# Patient Record
Sex: Male | Born: 1990 | Hispanic: No | Marital: Single | Smoking: Current every day smoker
Health system: Southern US, Community
[De-identification: ages and names within clinical notes are randomized; demographics above are authoritative.]

## PROBLEM LIST (undated history)

## (undated) DIAGNOSIS — Z9189 Other specified personal risk factors, not elsewhere classified: Secondary | ICD-10-CM

## (undated) HISTORY — PX: SHOULDER SURGERY: SHX246

---
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
## 2010-01-26 ENCOUNTER — Emergency Department (HOSPITAL_COMMUNITY)
Admission: EM | Admit: 2010-01-26 | Discharge: 2010-01-26 | Payer: Self-pay | Source: Home / Self Care | Admitting: Emergency Medicine

## 2010-02-06 ENCOUNTER — Encounter
Admission: RE | Admit: 2010-02-06 | Discharge: 2010-02-18 | Payer: Self-pay | Source: Home / Self Care | Attending: Orthopedic Surgery | Admitting: Orthopedic Surgery

## 2011-12-23 ENCOUNTER — Emergency Department (HOSPITAL_COMMUNITY)
Admission: EM | Admit: 2011-12-23 | Discharge: 2011-12-24 | Disposition: A | Discharge status: Home / Self Care | Payer: Self-pay | Attending: Emergency Medicine | Admitting: Emergency Medicine

## 2011-12-23 ENCOUNTER — Encounter (HOSPITAL_COMMUNITY): Payer: Self-pay | Admitting: Emergency Medicine

## 2011-12-23 ENCOUNTER — Emergency Department (HOSPITAL_COMMUNITY): Payer: Self-pay

## 2011-12-23 DIAGNOSIS — F17200 Nicotine dependence, unspecified, uncomplicated: Secondary | ICD-10-CM | POA: Insufficient documentation

## 2011-12-23 DIAGNOSIS — R569 Unspecified convulsions: Secondary | ICD-10-CM | POA: Insufficient documentation

## 2011-12-23 LAB — CBC WITH DIFFERENTIAL/PLATELET
Basophils Relative: 0 % (ref 0–1)
Eosinophils Absolute: 0.3 10*3/uL (ref 0.0–0.7)
Eosinophils Relative: 1 % (ref 0–5)
HCT: 42.3 % (ref 39.0–52.0)
Hemoglobin: 14.8 g/dL (ref 13.0–17.0)
MCH: 30.6 pg (ref 26.0–34.0)
MCHC: 35 g/dL (ref 30.0–36.0)
Monocytes Absolute: 1.5 10*3/uL — ABNORMAL HIGH (ref 0.1–1.0)
Monocytes Relative: 8 % (ref 3–12)

## 2011-12-23 NOTE — ED Provider Notes (Signed)
History     CSN: [id number]  Arrival date & time 12/23/11  2144   First MD Initiated Contact with Patient 12/23/11 2321      Chief Complaint  Patient presents with  . Seizures    (Consider location/radiation/quality/duration/timing/severity/associated sxs/prior treatment) HPI Comments: 21 year old male with no significant medical history who has never had a seizure who presents after having a first-time seizure this evening. His family members state that they saw him sitting in a chair after smoking a cigarette his head turned to the side, his eyes rolled back, he started frothing at the mouth and turning red in the face. He began having symmetrical convulsions of the upper and lower strumming these, he was the ground and held on his side. He states this lasted approximately 2-3 minutes before it stopped on by itself, there was a small amount of tongue biting but no sign of urinary incontinence. When he first woke up he has slight disorientation but had a rapid return to his baseline. He denies any other medications, he does not drink alcohol but once every couple of weeks, his last drink was half of beer on Monday night. He denies drug use, he does admit to having chronic headaches which seem to be getting worse over the last couple of months.  Patient is a 21 y.o. male presenting with seizures. The history is provided by the patient, a parent, a relative and the EMS personnel.  Seizures     History reviewed. No pertinent past medical history.  Past Surgical History  Procedure Date  . Shoulder surgery     Right shoulder    No family history on file.  History  Substance Use Topics  . Smoking status: Current Every Day Smoker    Types: Cigarettes  . Smokeless tobacco: Not on file  . Alcohol Use: Yes     Comment: occasionally       Review of Systems  Neurological: Positive for seizures.  All other systems reviewed and are negative.    Allergies  Review of patient's  allergies indicates no known allergies.  Home Medications   Current Outpatient Rx  Name  Route  Sig  Dispense  Refill  . IBUPROFEN 200 MG PO TABS   Oral   Take 600 mg by mouth every 6 (six) hours as needed. For pain           BP 139/87  Pulse 84  Temp 97.6 F (36.4 C) (Oral)  Resp 16  SpO2 100%  Physical Exam  Nursing note and vitals reviewed. Constitutional: He appears well-developed and well-nourished. No distress.  HENT:  Head: Normocephalic and atraumatic.  Mouth/Throat: Oropharynx is clear and moist. No oropharyngeal exudate.       2 very small areas of injury to the right and left side of the tongue, no lacerations to repair, mucous membranes moist  Eyes: Conjunctivae normal and EOM are normal. Pupils are equal, round, and reactive to light. Right eye exhibits no discharge. Left eye exhibits no discharge. No scleral icterus.  Neck: Normal range of motion. Neck supple. No JVD present. No thyromegaly present.  Cardiovascular: Normal rate, regular rhythm, normal heart sounds and intact distal pulses.  Exam reveals no gallop and no friction rub.   No murmur heard. Pulmonary/Chest: Effort normal and breath sounds normal. No respiratory distress. He has no wheezes. He has no rales.  Abdominal: Soft. Bowel sounds are normal. He exhibits no distension and no mass. There is no tenderness.  Musculoskeletal: Normal  range of motion. He exhibits no edema and no tenderness.  Lymphadenopathy:    He has no cervical adenopathy.  Neurological: He is alert. Coordination normal.       The patient has normal speech, normal memory, normal coordination of all 4 extremities without any limb ataxia. The patient has normal strength of all 4 extremities at the major muscle groups including shoulders, biceps, triceps, forearm, grips, quadriceps, hamstrings, calves. Normal sensation to light touch and pinprick of all 4 extremities and the trunk. No truncal ataxia, normal gait, cranial nerves III  through XII are intact.  Normal peripheral visual fields. Normal extraocular movements. Normal pupillary exam. No pronator drift, normal reflexes at the brachial radialis, biceps and patellar tendons. Normal position sense, normal temperature sensation to cold and warm  Skin: Skin is warm and dry. No rash noted. No erythema.  Psychiatric: He has a normal mood and affect. His behavior is normal.    ED Course  Procedures (including critical care time)  Labs Reviewed  GLUCOSE, CAPILLARY - Abnormal; Notable for the following:    Glucose-Capillary 101 (*)     All other components within normal limits   No results found.   No diagnosis found.    MDM  At this time the patient appears well, he is not having any seizure activity, his vital signs appear normal, his EKG is normal, because of chronic headaches will obtain CT scan lab work, reevaluate.   ED ECG REPORT  I personally interpreted this EKG   Date: 12/23/2011   Rate: 74  Rhythm: normal sinus rhythm  QRS Axis: normal  Intervals: normal  ST/T Wave abnormalities: normal  Conduction Disutrbances:none  Narrative Interpretation:   Old EKG Reviewed: none available  CT scan signs, lab work with leukocytosis but no other findings, patient stable discharge.  He has had no further seizures since his arrival and his mental status is normal. He has been informed of this laboratory and CT findings.       Vida Roller, MD 12/24/11 [phone]

## 2011-12-23 NOTE — ED Notes (Signed)
I gave the patient a warm blanket. 

## 2011-12-23 NOTE — ED Notes (Signed)
Pt family member reports that they were sitting at table and that pt smoked half a cigarette (pt does not remember smoking the cigarette). He handed the cigarette to family member and then began to shake. Pt. Eyes rolled back, head deviated to right and pt began to drool. Family member then placed pt. In the floor on side. Jerking movement lasted approx 3 mins and took approx 5 mins for pt to become alert again- per family member at bedside. No hx of past seizures. Pt is currently alert and oriented x4. No complaints.

## 2011-12-23 NOTE — ED Notes (Signed)
Pt arrived by ems. Family reports grand mal seizure lasting approx 5 min. Pt was post ictal after. Pt cool and diaphoretic upon ems arrival. Vs wnl. Bp 115/73. Hr 90's. 12 lead unremarkable. 20 G L ac. No complaints of pain. Pt did not fall. Pt is currently alert.

## 2011-12-24 LAB — BASIC METABOLIC PANEL
BUN: 13 mg/dL (ref 6–23)
Calcium: 9.7 mg/dL (ref 8.4–10.5)
Creatinine, Ser: 1.21 mg/dL (ref 0.50–1.35)
GFR calc Af Amer: >90 mL/min (ref >90)
GFR calc non Af Amer: 84 mL/min — ABNORMAL LOW (ref >90)

## 2011-12-24 MED ORDER — NAPROXEN 500 MG PO TABS: 500.0000 mg | ORAL_TABLET | Freq: Two times a day (BID) | ORAL | Status: DC

## 2014-06-02 ENCOUNTER — Encounter (HOSPITAL_COMMUNITY): Payer: Self-pay | Admitting: *Deleted

## 2014-06-02 ENCOUNTER — Emergency Department (HOSPITAL_COMMUNITY)
Admission: EM | Admit: 2014-06-02 | Discharge: 2014-06-02 | Disposition: A | Discharge status: Home / Self Care | Payer: Self-pay | Attending: Emergency Medicine | Admitting: Emergency Medicine

## 2014-06-02 DIAGNOSIS — K0889 Other specified disorders of teeth and supporting structures: Secondary | ICD-10-CM

## 2014-06-02 DIAGNOSIS — K088 Other specified disorders of teeth and supporting structures: Secondary | ICD-10-CM | POA: Insufficient documentation

## 2014-06-02 DIAGNOSIS — Z720 Tobacco use: Secondary | ICD-10-CM | POA: Insufficient documentation

## 2014-06-02 DIAGNOSIS — Z791 Long term (current) use of non-steroidal anti-inflammatories (NSAID): Secondary | ICD-10-CM | POA: Insufficient documentation

## 2014-06-02 MED ORDER — PENICILLIN V POTASSIUM 500 MG PO TABS: 500.0000 mg | ORAL_TABLET | Freq: Four times a day (QID) | ORAL | Status: AC

## 2014-06-02 MED ORDER — OXYCODONE-ACETAMINOPHEN 5-325 MG PO TABS: 1.0000 | ORAL_TABLET | ORAL | Status: DC | PRN

## 2014-06-02 MED ORDER — OXYCODONE-ACETAMINOPHEN 5-325 MG PO TABS
1.0000 | ORAL_TABLET | Freq: Once | ORAL | Status: AC
  Administered 2014-06-02: 1 via ORAL
  Filled 2014-06-02: qty 1

## 2014-06-02 NOTE — ED Provider Notes (Signed)
CSN: [id number]     Arrival date & time 06/02/14  0543 History   First MD Initiated Contact with Patient 06/02/14 0557     Chief Complaint  Patient presents with  . Dental Pain     (Consider location/radiation/quality/duration/timing/severity/associated sxs/prior Treatment) Patient is a 24 y.o. male presenting with tooth pain. The history is provided by the patient.  Dental Pain Location:  Upper and lower Upper teeth location:  16/LU 3rd molar and 1/RU 3rd molar Lower teeth location:  17/LL 3rd molar, 31/RL 2nd molar and 32/RL 3rd molar Quality:  Dull and aching Severity:  Mild Onset quality:  Gradual Duration:  2 months Timing:  Intermittent Progression:  Unchanged Chronicity:  New Context: dental caries   Relieved by:  Nothing Worsened by:  Nothing tried Ineffective treatments:  None tried Associated symptoms: no drooling, no fever, no headaches and no neck pain     History reviewed. No pertinent past medical history. Past Surgical History  Procedure Laterality Date  . Shoulder surgery      Right shoulder  . Shoulder surgery     History reviewed. No pertinent family history. History  Substance Use Topics  . Smoking status: Current Every Day Smoker    Types: Cigarettes  . Smokeless tobacco: Current User    Types: Chew  . Alcohol Use: Yes     Comment: occasionally     Review of Systems  Constitutional: Negative for fever.  HENT: Positive for dental problem. Negative for drooling and rhinorrhea.   Eyes: Negative for pain.  Respiratory: Negative for cough and shortness of breath.   Cardiovascular: Negative for chest pain and leg swelling.  Gastrointestinal: Negative for nausea, vomiting, abdominal pain and diarrhea.  Genitourinary: Negative for dysuria and hematuria.  Musculoskeletal: Negative for gait problem and neck pain.  Skin: Negative for color change.  Neurological: Negative for numbness and headaches.  Hematological: Negative for adenopathy.   Psychiatric/Behavioral: Negative for behavioral problems.  All other systems reviewed and are negative.     Allergies  Review of patient's allergies indicates no known allergies.  Home Medications   Prior to Admission medications   Medication Sig Start Date End Date Taking? Authorizing Provider  ibuprofen (ADVIL,MOTRIN) 200 MG tablet Take 600 mg by mouth every 6 (six) hours as needed. For pain    Historical Provider, MD  naproxen (NAPROSYN) 500 MG tablet Take 1 tablet (500 mg total) by mouth 2 (two) times daily with a meal. 12/24/11   Eber HongBrian Miller, MD   BP 126/86 mmHg  Pulse 71  Temp(Src) 97.6 F (36.4 C) (Oral)  Resp 16  SpO2 100% Physical Exam  Constitutional: He is oriented to person, place, and time. He appears well-developed and well-nourished.  HENT:  Head: Normocephalic and atraumatic.  Right Ear: External ear normal.  Left Ear: External ear normal.  Nose: Nose normal.  Mouth/Throat: Oropharynx is clear and moist. No oropharyngeal exudate.  No intraoral abscess noted.  Chronically fractured posterior molars.  No trismus. No facial swelling. Normal range of motion of the neck. Normal-appearing posterior oropharynx.  Eyes: Conjunctivae and EOM are normal. Pupils are equal, round, and reactive to light.  Neck: Normal range of motion. Neck supple.  Cardiovascular: Normal rate, regular rhythm, normal heart sounds and intact distal pulses.  Exam reveals no gallop and no friction rub.   No murmur heard. Pulmonary/Chest: Effort normal and breath sounds normal. No respiratory distress. He has no wheezes.  Abdominal: Soft. Bowel sounds are normal. He exhibits no distension.  There is no tenderness. There is no rebound and no guarding.  Musculoskeletal: Normal range of motion. He exhibits no edema or tenderness.  Neurological: He is alert and oriented to person, place, and time.  Skin: Skin is warm and dry.  Psychiatric: He has a normal mood and affect. His behavior is normal.   Nursing note and vitals reviewed.   ED Course  Procedures (including critical care time) Labs Review Labs Reviewed - No data to display  Imaging Review No results found.   EKG Interpretation None      MDM   Final diagnoses:  Pain, dental    6:19 AM 24 y.o. male who presents with intermittent dental pain for the last 1-2 months. He denies any fevers, vomiting, diarrhea. Vital signs unremarkable here. The patient was on antibiotics several weeks ago and is currently trying to get in to see a dentist. He has several chronically fractured teeth. Will restart antibiotics and give a small prescription for pain medicine.   Medications given during this visit Medications  oxyCODONE-acetaminophen (PERCOCET/ROXICET) 5-325 MG per tablet 1 tablet (not administered)    New Prescriptions   No medications on file       Purvis SheffieldForrest Evander Macaraeg, MD 06/02/14 [phone]

## 2014-06-02 NOTE — Discharge Instructions (Signed)
Dental Pain °A tooth ache may be caused by cavities (tooth decay). Cavities expose the nerve of the tooth to air and hot or cold temperatures. It may come from an infection or abscess (also called a boil or furuncle) around your tooth. It is also often caused by dental caries (tooth decay). This causes the pain you are having. °DIAGNOSIS  °Your caregiver can diagnose this problem by exam. °TREATMENT  °· If caused by an infection, it may be treated with medications which kill germs (antibiotics) and pain medications as prescribed by your caregiver. Take medications as directed. °· Only take over-the-counter or prescription medicines for pain, discomfort, or fever as directed by your caregiver. °· Whether the tooth ache today is caused by infection or dental disease, you should see your dentist as soon as possible for further care. °SEEK MEDICAL CARE IF: °The exam and treatment you received today has been provided on an emergency basis only. This is not a substitute for complete medical or dental care. If your problem worsens or new problems (symptoms) appear, and you are unable to meet with your dentist, call or return to this location. °SEEK IMMEDIATE MEDICAL CARE IF:  °· You have a fever. °· You develop redness and swelling of your face, jaw, or neck. °· You are unable to open your mouth. °· You have severe pain uncontrolled by pain medicine. °MAKE SURE YOU:  °· Understand these instructions. °· Will watch your condition. °· Will get help right away if you are not doing well or get worse. °Document Released: 01/05/2005 Document Revised: 03/30/2011 Document Reviewed: 08/24/2007 °ExitCare® Patient Information ©2015 [employer]. This information is not intended to replace advice given to you by your health care provider. Make sure you discuss any questions you have with your health care provider. ° °Emergency Department Resource Guide °1) Find a Doctor and Pay Out of Pocket °Although you won't have to find out who  is covered by your insurance plan, it is a good idea to ask around and get recommendations. You will then need to call the office and see if the doctor you have chosen will accept you as a new patient and what types of options they offer for patients who are self-pay. Some doctors offer discounts or will set up payment plans for their patients who do not have insurance, but you will need to ask so you aren't surprised when you get to your appointment. ° °2) Contact Your Local Health Department °Not all health departments have doctors that can see patients for sick visits, but many do, so it is worth a call to see if yours does. If you don't know where your local health department is, you can check in your phone book. The CDC also has a tool to help you locate your state's health department, and many state websites also have listings of all of their local health departments. ° °3) Find a Walk-in Clinic °If your illness is not likely to be very severe or complicated, you may want to try a walk in clinic. These are popping up all over the country in pharmacies, drugstores, and shopping centers. They're usually staffed by nurse practitioners or physician assistants that have been trained to treat common illnesses and complaints. They're usually fairly quick and inexpensive. However, if you have serious medical issues or chronic medical problems, these are probably not your best option. ° °No Primary Care Doctor: °- Call [facility] at  832-8000 - they can help you locate a primary   care doctor that  accepts your insurance, provides certain services, etc. °- Physician Referral Service- 1-800-533-3463 ° °Chronic Pain Problems: °Organization         Address  Phone   Notes  °[facility]  (336) 297-2271 Patients need to be referred by their primary care doctor.  ° °Medication Assistance: °Organization         Address  Phone   Notes  °[place] [facility] [address]  [address] °[place], [place] [address] (336) 641-8030 --Must be a resident of [place] °-- Must have NO insurance coverage whatsoever (no Medicaid/ Medicare, etc.) °-- The pt. MUST have a primary care doctor that directs their care regularly and follows them in the community °  °[facility]  (866) 331-1348   °[employer]  (888) 892-1162   ° °Agencies that provide inexpensive medical care: °Organization         Address  Phone   Notes  °[facility]  (336) 832-8035   °[facility]    (336) 832-7272   °[facility] [address] °[place], [place] [address] (336) 832-4777   °[facility] [address], °[place] (336) 271-4999   °[facility]    (336) 373-0678   °[facility]    (336) 272-1050   °[facility] ° [address], [place] Phone:  (336) 832-4444, Fax:  (336) 832-4440 Hours of Operation:  9 am - 6 pm, M-F.  Also accepts Medicaid/Medicare and self-pay.  °[facility] ° [address], [place] Phone: (336) 832-3150, Fax: (336) 832-3151. Hours of Operation:  8:30 am - 5:30 pm, M-F.  Also accepts Medicaid and self-pay.  °[facility] [place] [address], [place] Phone: (336) 878-6027   °[facility] [address], [place], [place] (336)723-1848, Ext. 123 Mondays & Thursdays: 7-9 AM.  First 15 patients are seen on a first come, first serve basis. °  ° °Medicaid-accepting [place] Providers: ° °Organization         Address  Phone   Notes  °[facility] [address], [place] (336) 641-2100 Also accepts self-pay patients.  °[facility] [address], [place] ° (336) 856-9996   °[facility] [address], [place] (336) 288-8857   °[facility] [address] [place] [address], [place] (336) 299-7000   °Veita Bland [address], [place]  ° (336) 373-1557 Only accepts [place] Access Medicaid patients after they have their name applied to their card.  ° °Self-Pay (no insurance) in [place]: ° °Organization         Address  Phone   Notes  °Sickle Cell Patients, [facility] [address], [place] (336) 832-1970   °[facility] [address], [place] (336) 832-4400   °[facility] ° [address], [place] (336) 992-4800   °[facility]/Dr. Osei-Bonsu ° [address] [place] [address], [place] or [address], [place] (336) 841-8500 Phone number for both [place] and [place] locations is the same.  °[facility] [address], [place] (336) 299-0000   °[facility] [address] [place] [address],  or [address] (336) 852-7530 °(336) 878-2260   °  [facility] [address], [place] (336) 350-1642, phone; (336) 294-5005, fax Sees patients 1st and 3rd Saturday of every month.  Must not qualify for public or private insurance (i.e. Medicaid, Medicare, [place] Health Choice, Veterans' Benefits) • Household income should be no more than 200% of the poverty level •The clinic cannot treat you if you are pregnant or think you are pregnant • Sexually transmitted diseases are not treated at the clinic.  ° ° °Dental Care: °Organization         Address  Phone  Notes  °[facility] [address], [place] (336) 641-6152 Accepts children up to age 21 who are enrolled in Medicaid or [place] Health Choice; pregnant women with a Medicaid card; and children who have applied for Medicaid or [place] Health Choice, but were declined, whose parents can pay a reduced fee at time of service.  °[facility] [place]  [address], [place] (336) 641-7733 Accepts children up to age 21 who are  enrolled in Medicaid or [place] Health Choice; pregnant women with a Medicaid card; and children who have applied for Medicaid or [place] Health Choice, but were declined, whose parents can pay a reduced fee at time of service.  °[facility] ° [address], [place] (336) 641-4533 Patients are seen by appointment only. Walk-ins are not accepted. [facility] will see patients 18 years of age and older. °Monday - Tuesday (8am-5pm) °Most Wednesdays (8:30-5pm) °$30 per visit, cash only  °[facility] ° [address], [place] (336) 641-4533 Patients are seen by appointment only. Walk-ins are not accepted. [facility] will see patients 18 years of age and older. °One Wednesday Evening (Monthly: Volunteer Based).  $30 per visit, cash only  °[facility]  (919) 537-3737 for adults; Children under age 4, call Graduate Pediatric Dentistry at ([phone]) [phone]. Children aged 4-14, please call (919) [phone] to request a pediatric application. ° Dental services are provided in all areas of dental care including fillings, crowns and bridges, complete and partial dentures, implants, gum treatment, root canals, and extractions. Preventive care is also provided. Treatment is provided to both adults and children. °Patients are selected via a lottery and there is often a waiting list. °  °[facility] [address], °[place] ° (336) 763-8833 [email or web address] °  °[facility] [address], [place], [place] (336)723-1848, Ext. 123 Second and Fourth Thursday of each month, opens at 6:30 AM; Clinic ends at 9 AM.  Patients are seen on a first-come first-served basis, and a limited number are seen during each clinic.  ° °[facility] ° [address], [place],  (336) 723-7904   Eligibility Requirements °You must have lived in [place], [place], or [place] counties for at least the last three months. °  You  cannot be eligible for state or federal sponsored healthcare insurance, including Veterans Administration, Medicaid, or Medicare. °  You generally cannot be eligible for healthcare insurance through your employer.  °  How to apply: °Eligibility screenings are held every Tuesday and Wednesday afternoon from 1:00 pm until 4:00 pm. You do not need an appointment for the interview!  °[facility] [address], [place],  336-631-2330   °[facility]  336-342-8273   °[facility]  336-703-3100   °[facility]  336-570-6415   ° °Behavioral Health Resources in the Community: °Intensive Outpatient Programs °Organization         Address  Phone  Notes  °[facility] [address], [place], [place] 336-878-6098   °[facility] [address], [place], [place] 336-832-9800   °[facility] [address], [place], [place] ° 336-882-2125   °[facility] [address],  °[place], [place] 1-800-853-5163 or 336-641-4981   °Substance Abuse Resources °Organization         Address  Phone  Notes  °[facility]  336-882-2125   °[facility]  336-784-9470   °[facility]  336-285-9073   °[facility]  336-845-3988   °Residential & Outpatient Substance Abuse Program  1-800-659-3381   °Psychological Services °Organization         Address  Phone  Notes  °[facility]  336- 832-9600   °[facility]  336- 378-7881   °[facility] [address], [place] 1-800-853-5163 or 336-641-4981   ° °Mobile Crisis Teams °Organization         Address  Phone  Notes  °[facility], Mobile Crisis Care Unit  1-877-626-1772   °Assertive °[facility] ° [address] [place], [place] 336-834-9664   °Sharon DeEsch [address] °[place] 336-554-5454   ° °Self-Help/Support  Groups °Organization         Address  Phone             Notes  °[facility] - variety of support groups  336- 373-1402 Call for more information  °Narcotics Anonymous (NA), [facility] [address], °[place]  2 meetings at this location  ° °Residential Treatment Programs °Organization         Address  Phone  Notes  °[facility] [address],    °[place]  1-866-801-8205   °[facility] ° [address], [place], [place] 704-293-8524   °[facility] [address], [place] 336-845-3988 Admissions: 8am-3pm M-F  °[facility] [address]    °[place], [place] 336-841-1104   °[facility] [address], [place], [place] 336-379-7146   °[facility] [address]  °[place], [place] 336-285-9073   °[facility] Intensive Outpatient [address], [place], [place] 336-852-3033   °[facility] [address]  °[place], [place] 1-877-615-2722 or 336-784-9470   °[facility] [address] [place], [place] 336-227-7417 Accepts Medicaid  °[facility] [address]  ° [place] 1-800-659-3381 Substance Abuse/Addiction Treatment  ° °[place] Behavioral Health Resources °Organization         Address  Phone  Notes  °[facility]  (888) 581-9988   °Julie Brannon, PhD [address] [place], [place]   (336) 349-5553 or (336) 951-0000   °[facility]   [address] °[place], [place] (336) 349-4454   °[facility] [address], [place],  (336) 342-8316 Insurance/Medicaid/sponsorship through [facility]  °[facility] [address]                                    ,  (336) 342-8316 Therapy/tele-psych/case  °[facility]   [address]  ° [place], [place] (336) 349-2233    °Dr. Arfeen  (336) 349-4544   °[facility] [place]  [employer] [place] [facility] 1) [address], [place] °2) [address], [place] °3)  [address], [place] (336) 349-3220 °(336) 342-7768 ° °(336) 342-8140   °[place] [facility] (336) 342-1394 or (336) 342-3537 (After Hours)    ° ° ° °

## 2014-06-02 NOTE — ED Notes (Signed)
Pt.has had dental for the past couple of months. Had a few teeth removed during a free dental clinic a couple of weeks ago. Some teeth are still bothering pt. Pt has an appoint ment mid June to remove a few more teeth.pt states its too painful to wait.

## 2014-08-26 ENCOUNTER — Emergency Department (HOSPITAL_COMMUNITY)
Admission: EM | Admit: 2014-08-26 | Discharge: 2014-08-26 | Disposition: A | Discharge status: Home / Self Care | Payer: Self-pay | Attending: Emergency Medicine | Admitting: Emergency Medicine

## 2014-08-26 ENCOUNTER — Encounter (HOSPITAL_COMMUNITY): Payer: Self-pay | Admitting: *Deleted

## 2014-08-26 DIAGNOSIS — K0381 Cracked tooth: Secondary | ICD-10-CM | POA: Insufficient documentation

## 2014-08-26 DIAGNOSIS — Z791 Long term (current) use of non-steroidal anti-inflammatories (NSAID): Secondary | ICD-10-CM | POA: Insufficient documentation

## 2014-08-26 DIAGNOSIS — Z9189 Other specified personal risk factors, not elsewhere classified: Secondary | ICD-10-CM | POA: Insufficient documentation

## 2014-08-26 DIAGNOSIS — Z720 Tobacco use: Secondary | ICD-10-CM | POA: Insufficient documentation

## 2014-08-26 DIAGNOSIS — K047 Periapical abscess without sinus: Secondary | ICD-10-CM | POA: Insufficient documentation

## 2014-08-26 HISTORY — DX: Other specified personal risk factors, not elsewhere classified: Z91.89

## 2014-08-26 MED ORDER — AMOXICILLIN 875 MG PO TABS: 875.0000 mg | ORAL_TABLET | Freq: Two times a day (BID) | ORAL | Status: DC

## 2014-08-26 MED ORDER — HYDROCODONE-ACETAMINOPHEN 5-325 MG PO TABS
1.0000 | ORAL_TABLET | Freq: Once | ORAL | Status: AC
  Administered 2014-08-26: 1 via ORAL
  Filled 2014-08-26: qty 1

## 2014-08-26 MED ORDER — HYDROCODONE-ACETAMINOPHEN 5-325 MG PO TABS: 1.0000 | ORAL_TABLET | Freq: Once | ORAL | Status: DC

## 2014-08-26 NOTE — ED Notes (Signed)
Declined W/C at D/C and was escorted to lobby by RN. 

## 2014-08-26 NOTE — ED Provider Notes (Signed)
CSN: [id number]     Arrival date & time 08/26/14  1450 History   First MD Initiated Contact with Patient 08/26/14 1506     Chief Complaint  Patient presents with  . Dental Problem     (Consider location/radiation/quality/duration/timing/severity/associated sxs/prior Treatment) HPI  He is a 24 year old man here for evaluation of dental pain. He reports the bottom right for teeth and an upper right tooth are hurting him. This started this morning. He reports tasting pus in his mouth. No fevers. No difficulty swallowing. He does not have a Education officer, community.  Past Medical History  Diagnosis Date  . Poor dental hygiene    Past Surgical History  Procedure Laterality Date  . Shoulder surgery      Right shoulder  . Shoulder surgery     History reviewed. No pertinent family history. History  Substance Use Topics  . Smoking status: Current Every Day Smoker    Types: Cigarettes  . Smokeless tobacco: Current User    Types: Chew  . Alcohol Use: Yes     Comment: occasionally     Review of Systems  As in history of present illness  Allergies  Review of patient's allergies indicates no known allergies.  Home Medications   Prior to Admission medications   Medication Sig Start Date End Date Taking? Authorizing Provider  amoxicillin (AMOXIL) 875 MG tablet Take 1 tablet (875 mg total) by mouth 2 (two) times daily. 08/26/14   Charm Rings, MD  HYDROcodone-acetaminophen (NORCO/VICODIN) 5-325 MG per tablet Take 1 tablet by mouth once. 08/26/14   Charm Rings, MD  ibuprofen (ADVIL,MOTRIN) 200 MG tablet Take 600 mg by mouth every 6 (six) hours as needed. For pain    Historical Provider, MD  naproxen (NAPROSYN) 500 MG tablet Take 1 tablet (500 mg total) by mouth 2 (two) times daily with a meal. 12/24/11   Eber Hong, MD  oxyCODONE-acetaminophen (PERCOCET) 5-325 MG per tablet Take 1 tablet by mouth every 4 (four) hours as needed. 06/02/14   Purvis Sheffield, MD   BP 130/85 mmHg  Pulse 107  Temp(Src) 97.8  F (36.6 C) (Oral)  Resp 18  Ht  (1.702 m)  Wt 157 lb (71.215 kg)  BMI 24.58 kg/m2  SpO2 98% Physical Exam  Constitutional: He is oriented to person, place, and time. He appears well-developed and well-nourished. No distress.  HENT:  Mouth/Throat: Abnormal dentition (in general poor dentition).    Cardiovascular:  Mild tachycardia  Pulmonary/Chest: Effort normal.  Neurological: He is alert and oriented to person, place, and time.    ED Course  Procedures (including critical care time) Labs Review Labs Reviewed - No data to display  Imaging Review No results found.   EKG Interpretation None      MDM   Final diagnoses:  Dental infection    Treat with amoxicillin and Vicodin. Handout given. Dental resources given.    Charm Rings, MD 08/26/14 1550

## 2014-08-26 NOTE — ED Notes (Signed)
PT reports teeth to RT lower side pain with pus.

## 2014-08-26 NOTE — Discharge Instructions (Signed)
Abscessed Tooth An abscessed tooth is an infection around your tooth. It may be caused by holes or damage to the tooth (cavity) or a dental disease. An abscessed tooth causes mild to very bad pain in and around the tooth. See your dentist right away if you have tooth or gum pain. HOME CARE  Take your medicine as told. Finish it even if you start to feel better.  Do not drive after taking pain medicine.  Rinse your mouth (gargle) often with salt water ( teaspoon salt in 8 ounces of warm water).  Do not apply heat to the outside of your face. GET HELP RIGHT AWAY IF:   You have a temperature by mouth above 102 F (38.9 C), not controlled by medicine.  You have chills and a very bad headache.  You have problems breathing or swallowing.  Your mouth will not open.  You develop puffiness (swelling) on the neck or around the eye.  Your pain is not helped by medicine.  Your pain is getting worse instead of better. MAKE SURE YOU:   Understand these instructions.  Will watch your condition.  Will get help right away if you are not doing well or get worse. Document Released: 06/24/2007 Document Revised: 03/30/2011 Document Reviewed: 04/15/2010 ExitCare Patient Information 2015 [employer]. This information is not intended to replace advice given to you by your health care provider. Make sure you discuss any questions you have with your health care provider.  

## 2014-10-05 ENCOUNTER — Emergency Department (HOSPITAL_COMMUNITY)
Admission: EM | Admit: 2014-10-05 | Discharge: 2014-10-05 | Disposition: A | Discharge status: Home / Self Care | Payer: Self-pay | Attending: Emergency Medicine | Admitting: Emergency Medicine

## 2014-10-05 ENCOUNTER — Encounter (HOSPITAL_COMMUNITY): Payer: Self-pay | Admitting: Emergency Medicine

## 2014-10-05 DIAGNOSIS — Z720 Tobacco use: Secondary | ICD-10-CM | POA: Insufficient documentation

## 2014-10-05 DIAGNOSIS — K0889 Other specified disorders of teeth and supporting structures: Secondary | ICD-10-CM

## 2014-10-05 DIAGNOSIS — K002 Abnormalities of size and form of teeth: Secondary | ICD-10-CM | POA: Insufficient documentation

## 2014-10-05 DIAGNOSIS — Z792 Long term (current) use of antibiotics: Secondary | ICD-10-CM | POA: Insufficient documentation

## 2014-10-05 DIAGNOSIS — K088 Other specified disorders of teeth and supporting structures: Secondary | ICD-10-CM | POA: Insufficient documentation

## 2014-10-05 DIAGNOSIS — Z79899 Other long term (current) drug therapy: Secondary | ICD-10-CM | POA: Insufficient documentation

## 2014-10-05 DIAGNOSIS — K029 Dental caries, unspecified: Secondary | ICD-10-CM | POA: Insufficient documentation

## 2014-10-05 MED ORDER — PENICILLIN V POTASSIUM 500 MG PO TABS: 500.0000 mg | ORAL_TABLET | Freq: Four times a day (QID) | ORAL | Status: AC

## 2014-10-05 MED ORDER — PENICILLIN V POTASSIUM 250 MG PO TABS
500.0000 mg | ORAL_TABLET | Freq: Once | ORAL | Status: AC
  Administered 2014-10-05: 500 mg via ORAL
  Filled 2014-10-05: qty 2

## 2014-10-05 MED ORDER — HYDROCODONE-ACETAMINOPHEN 5-325 MG PO TABS
2.0000 | ORAL_TABLET | Freq: Once | ORAL | Status: AC
  Administered 2014-10-05: 2 via ORAL
  Filled 2014-10-05: qty 2

## 2014-10-05 MED ORDER — NAPROXEN 500 MG PO TABS: 500.0000 mg | ORAL_TABLET | Freq: Two times a day (BID) | ORAL | Status: DC

## 2014-10-05 NOTE — Discharge Instructions (Signed)
Take Penicillin as prescribed. Follow up with a dentist. Take Naproxen as needed for pain control. You may also refer to the attached resource guide for additional dental services.  Dental Pain A tooth ache may be caused by cavities (tooth decay). Cavities expose the nerve of the tooth to air and hot or cold temperatures. It may come from an infection or abscess (also called a boil or furuncle) around your tooth. It is also often caused by dental caries (tooth decay). This causes the pain you are having. DIAGNOSIS  Your caregiver can diagnose this problem by exam. TREATMENT   If caused by an infection, it may be treated with medications which kill germs (antibiotics) and pain medications as prescribed by your caregiver. Take medications as directed.  Only take over-the-counter or prescription medicines for pain, discomfort, or fever as directed by your caregiver.  Whether the tooth ache today is caused by infection or dental disease, you should see your dentist as soon as possible for further care. SEEK MEDICAL CARE IF: The exam and treatment you received today has been provided on an emergency basis only. This is not a substitute for complete medical or dental care. If your problem worsens or new problems (symptoms) appear, and you are unable to meet with your dentist, call or return to this location. SEEK IMMEDIATE MEDICAL CARE IF:   You have a fever.  You develop redness and swelling of your face, jaw, or neck.  You are unable to open your mouth.  You have severe pain uncontrolled by pain medicine. MAKE SURE YOU:   Understand these instructions.  Will watch your condition.  Will get help right away if you are not doing well or get worse. Document Released: 01/05/2005 Document Revised: 03/30/2011 Document Reviewed: 08/24/2007 [facility] Patient Information 2015 [place], [place]. This information is not intended to replace advice given to you by your health care provider. Make sure you  discuss any questions you have with your health care provider.  Emergency Department Resource Guide 1) Find a Doctor and Pay Out of Pocket Although you won't have to find out who is covered by your insurance plan, it is a good idea to ask around and get recommendations. You will then need to call the office and see if the doctor you have chosen will accept you as a new patient and what types of options they offer for patients who are self-pay. Some doctors offer discounts or will set up payment plans for their patients who do not have insurance, but you will need to ask so you aren't surprised when you get to your appointment.  2) Contact Your Local Health Department Not all health departments have doctors that can see patients for sick visits, but many do, so it is worth a call to see if yours does. If you don't know where your local health department is, you can check in your phone book. The CDC also has a tool to help you locate your state's health department, and many state websites also have listings of all of their local health departments.  3) Find a Walk-in Clinic If your illness is not likely to be very severe or complicated, you may want to try a walk in clinic. These are popping up all over the country in pharmacies, drugstores, and shopping centers. They're usually staffed by nurse practitioners or physician assistants that have been trained to treat common illnesses and complaints. They're usually fairly quick and inexpensive. However, if you have serious medical issues or  chronic medical problems, these are probably not your best option.  No Primary Care Doctor: - Call Health Connect at  [phone] - they can help you locate a primary care doctor that  accepts your insurance, provides certain services, etc. - Physician Referral Service- [phone]  Chronic Pain Problems: Organization         Address  Phone   Notes  [facility]  [phone] Patients need to be  referred by their primary care doctor.   Medication Assistance: Organization         Address  Phone   Notes  [facility] [address] [place], [place] [address] [phone] --Must be a resident of [facility] -- Must have NO insurance coverage whatsoever (no Medicaid/ Medicare, etc.) -- The pt. MUST have a primary care doctor that directs their care regularly and follows them in the community   [facility]  [phone]   [employer]  [phone]    Agencies that provide inexpensive medical care: Organization         Address  Phone   Notes  [facility]  [phone]   [facility]    [phone]   [facility] [address] [place], [place] [address] [phone]   [facility] [address] [place] [phone]   Planned Parenthood    [phone]   [facility]    [phone]   [facility]  [address], [place] Phone:  [phone], Fax:  [phone] Hours of Operation:  9 am - 6 pm, M-F.  Also accepts Medicaid/Medicare and self-pay.  [facility]  [address], [place] Phone: [phone], Fax: [phone]. Hours of Operation:  8:30 am - 5:30 pm, M-F.  Also accepts Medicaid and self-pay.  [facility] [place] [address] [place] Phone: [phone]   [facility] [address] [place]asha Bence [place], [place] [phone], Ext. [phone] Mondays & Thursdays: 7-9 AM.  First 15 patients are seen on a first come, first serve basis.    Medicaid-accepting [facility] Providers:  Organization         Address  Phone   Notes  [facility] [address], [place] [phone] Also accepts self-pay patients.  [facility] [address] Laurell Josephs [place], [place]  [phone]   [facility] [address], [place] [phone]   [facility] [address], [place] [phone]   Renaye Rakers [address], [place]   [phone] Only accepts [place] patients after they have their name applied to their card.   Self-Pay (no insurance) in [facility]:  Organization         Address  Phone   Notes  Sickle Cell Patients, [facility] [address] [place], [place] [phone]   [facility] [address] [place], [place] [phone]   [facility] [place]  [address], [place] [phone]   [facility]/Dr. Osei-Bonsu  [address] [place] or [address], [place] [phone] Phone number for both [place] and [place] locations is the same.  [facility] [address], [place] [phone]   [facility] [address] [place] [address], [place] or [address] [phone]   [facility] [address], [place] [phone]) [phone], phone; [phone]) [phone], fax Sees patients 1st and 3rd Saturday of every month.  Must not qualify for public or private insurance (i.e. Medicaid, Medicare, [place] Health Choice, Veterans' Benefits)  Household income should be no more than 200% of the poverty level The clinic cannot treat you if you are pregnant or think you are pregnant  Sexually transmitted diseases are not treated at the clinic.    Dental Care: Organization         Address  Phone  Notes  [facility] [address], [place] [phone] Accepts children up to age 25 who are enrolled in [place] or [place] Health Choice; pregnant women with a Medicaid card; and children who have applied for Medicaid or [place] Health Choice, but were declined, whose parents can  pay a reduced fee at time of service.  [facility] [place]  [address], [place] [phone] Accepts children up to age 25 who are enrolled in [place] or [place] Health Choice; pregnant women with a Medicaid card; and children who have applied for Medicaid or [place] Health Choice, but were declined, whose parents can pay a reduced fee at time of service.  [facility]  [address], [place] [phone] Patients are seen by appointment only. Walk-ins are not accepted. [facility] will see patients 25 years of age and older. Monday - Tuesday (8am-5pm) Most Wednesdays (8:30-5pm) $30 per visit, cash only  [facility]  [address], [facility] [place] [phone]) [phone] Patients are seen by appointment only. Walk-ins are not accepted. [facility] will see patients 25 years of age and older. One Wednesday Evening (Monthly: Volunteer Based).  $30 per visit, cash only  [employer] [facility]  [phone] for adults; Children under age 714, call Graduate Pediatric Dentistry at [phone]. Children aged 344-14, please call [phone] to request a pediatric application.  Dental services are provided in all areas of dental care including fillings, crowns and bridges, complete and partial dentures, implants, gum treatment, root canals, and extractions. Preventive care is also provided. Treatment is provided to both adults and children. Patients are selected via a lottery and there is often a waiting list.   [facility] [address], [place]  [phone] [email or web address]   [facility] [address], [place], [place] [phone], Ext. [phone] Second and Fourth Thursday of each month, opens at 6:30 AM; Clinic ends at 9 AM.  Patients are seen on a first-come first-served basis, and a limited number are seen during each clinic.   [facility]  [address], [place], [place] [phone]   Eligibility Requirements You must have lived in [place], or [place] counties for at least the last three months.   You cannot be eligible for state or federal sponsored [place]healthcare insurance, including CIGNAVeterans Administration, [place]icaid, or [employer]icare.   You generally cannot be eligible for healthcare insurance through your employer.    How to apply: Eligibility screenings are held every Tuesday and Wednesday afternoon from 1:00 pm until 4:00 pm. You do not need an appointment for the interview!  [facility] [address], [place], [place] [phone]   °[place]  [phone]   °[place] [facility]  [phone]   °[place] [facility]  [phone]   ° °

## 2014-10-05 NOTE — ED Notes (Signed)
Pt. reports right upper and lower dental pain onset 2 weeks ago worse this evening .

## 2014-10-05 NOTE — ED Provider Notes (Signed)
CSN: [id number]     Arrival date & time 10/05/14  0210 History   First MD Initiated Contact with Patient 10/05/14 0232     Chief Complaint  Patient presents with  . Dental Pain     (Consider location/radiation/quality/duration/timing/severity/associated sxs/prior Treatment) Patient is a 24 y.o. male presenting with tooth pain. The history is provided by the patient. No language interpreter was used.  Dental Pain Location:  Lower and upper Upper teeth location:  2/RU 2nd molar Lower teeth location:  31/RL 2nd molar, 30/RL 1st molar and 29/RL 2nd bicuspid Quality:  Aching and throbbing Severity:  Moderate Onset quality:  Gradual Duration:  2 weeks Timing:  Constant Progression:  Worsening Context: dental caries and poor dentition   Context: not trauma   Relieved by:  Nothing Worsened by:  Touching and pressure Ineffective treatments: aspirin. Associated symptoms: facial pain   Associated symptoms: no facial swelling, no fever, no oral bleeding and no oral lesions   Risk factors: lack of dental care and smoking     Past Medical History  Diagnosis Date  . Poor dental hygiene    Past Surgical History  Procedure Laterality Date  . Shoulder surgery      Right shoulder  . Shoulder surgery     No family history on file. Social History  Substance Use Topics  . Smoking status: Current Every Day Smoker    Types: Cigarettes  . Smokeless tobacco: Current User    Types: Chew  . Alcohol Use: Yes     Comment: occasionally     Review of Systems  Constitutional: Negative for fever.  HENT: Positive for dental problem. Negative for facial swelling and mouth sores.   All other systems reviewed and are negative.   Allergies  Review of patient's allergies indicates no known allergies.  Home Medications   Prior to Admission medications   Medication Sig Start Date End Date Taking? Authorizing Provider  amoxicillin (AMOXIL) 875 MG tablet Take 1 tablet (875 mg total) by mouth 2  (two) times daily. 08/26/14   Charm Rings, MD  HYDROcodone-acetaminophen (NORCO/VICODIN) 5-325 MG per tablet Take 1 tablet by mouth once. 08/26/14   Charm Rings, MD  ibuprofen (ADVIL,MOTRIN) 200 MG tablet Take 600 mg by mouth every 6 (six) hours as needed. For pain    Historical Provider, MD  naproxen (NAPROSYN) 500 MG tablet Take 1 tablet (500 mg total) by mouth 2 (two) times daily with a meal. 10/05/14   Antony Madura, PA-C  oxyCODONE-acetaminophen (PERCOCET) 5-325 MG per tablet Take 1 tablet by mouth every 4 (four) hours as needed. 06/02/14   Purvis Sheffield, MD  penicillin v potassium (VEETID) 500 MG tablet Take 1 tablet (500 mg total) by mouth 4 (four) times daily. 10/05/14 10/12/14  Antony Madura, PA-C   BP 130/83 mmHg  Pulse 76  SpO2 99%   Physical Exam  Constitutional: He is oriented to person, place, and time. He appears well-developed and well-nourished. No distress.  Nontoxic/nonseptic appearing.  HENT:  Head: Normocephalic and atraumatic.  Mouth/Throat: Uvula is midline, oropharynx is clear and moist and mucous membranes are normal. No trismus in the jaw. Abnormal dentition. Dental caries present. No dental abscesses or uvula swelling.    Mild right lower gingival swelling. Uvula midline. Patient tolerating secretions. No trismus.  Eyes: Conjunctivae and EOM are normal. No scleral icterus.  Neck: Normal range of motion.  No nuchal rigidity or meningismus.  Pulmonary/Chest: Effort normal. No respiratory distress.  Musculoskeletal: Normal range  of motion.  Neurological: He is alert and oriented to person, place, and time. He exhibits normal muscle tone. Coordination normal.  Skin: Skin is warm and dry. No rash noted. He is not diaphoretic. No erythema. No pallor.  Psychiatric: He has a normal mood and affect. His behavior is normal.  Nursing note and vitals reviewed.   ED Course  Procedures (including critical care time) Labs Review Labs Reviewed - No data to display  Imaging  Review No results found.   I have personally reviewed and evaluated these images and lab results as part of my medical decision-making.   EKG Interpretation None      MDM   Final diagnoses:  Dentalgia    Patient with toothache. No gross abscess. Exam unconcerning for Ludwig's angina or spread of infection. Will treat with penicillin and Naproxen. Urged patient to follow-up with dentist. Referral and resource guide provided. Patient discharged in good condition with no unaddressed concerns.   Filed Vitals:   10/05/14 0300  BP: 130/83  Pulse: 76  SpO2: 99%      Antony Madura, PA-C 10/05/14 0308  Loren Racer, MD 10/05/14 [phone]

## 2014-10-05 NOTE — ED Notes (Signed)
Kelly, PA-C, at the bedside.  

## 2014-12-25 ENCOUNTER — Encounter (HOSPITAL_COMMUNITY): Payer: Self-pay | Admitting: *Deleted

## 2014-12-25 ENCOUNTER — Emergency Department (HOSPITAL_COMMUNITY): Payer: Self-pay

## 2014-12-25 ENCOUNTER — Emergency Department (HOSPITAL_COMMUNITY)
Admission: EM | Admit: 2014-12-25 | Discharge: 2014-12-25 | Disposition: A | Discharge status: Home / Self Care | Payer: Self-pay | Attending: Emergency Medicine | Admitting: Emergency Medicine

## 2014-12-25 DIAGNOSIS — F17210 Nicotine dependence, cigarettes, uncomplicated: Secondary | ICD-10-CM | POA: Insufficient documentation

## 2014-12-25 DIAGNOSIS — Y998 Other external cause status: Secondary | ICD-10-CM | POA: Insufficient documentation

## 2014-12-25 DIAGNOSIS — Y9289 Other specified places as the place of occurrence of the external cause: Secondary | ICD-10-CM | POA: Insufficient documentation

## 2014-12-25 DIAGNOSIS — W132XXA Fall from, out of or through roof, initial encounter: Secondary | ICD-10-CM | POA: Insufficient documentation

## 2014-12-25 DIAGNOSIS — Z79899 Other long term (current) drug therapy: Secondary | ICD-10-CM | POA: Insufficient documentation

## 2014-12-25 DIAGNOSIS — Y9389 Activity, other specified: Secondary | ICD-10-CM | POA: Insufficient documentation

## 2014-12-25 DIAGNOSIS — M546 Pain in thoracic spine: Secondary | ICD-10-CM

## 2014-12-25 DIAGNOSIS — S299XXA Unspecified injury of thorax, initial encounter: Secondary | ICD-10-CM | POA: Insufficient documentation

## 2014-12-25 MED ORDER — ACETAMINOPHEN 500 MG PO TABS
1000.0000 mg | ORAL_TABLET | Freq: Once | ORAL | Status: AC
  Administered 2014-12-25: 1000 mg via ORAL
  Filled 2014-12-25: qty 2

## 2014-12-25 MED ORDER — IBUPROFEN 800 MG PO TABS
800.0000 mg | ORAL_TABLET | Freq: Once | ORAL | Status: AC
Start: 2014-12-25 — End: 2014-12-25
  Administered 2014-12-25: 800 mg via ORAL
  Filled 2014-12-25: qty 1

## 2014-12-25 MED ORDER — OXYCODONE HCL 5 MG PO TABS
5.0000 mg | ORAL_TABLET | Freq: Once | ORAL | Status: AC
  Administered 2014-12-25: 5 mg via ORAL
  Filled 2014-12-25: qty 1

## 2014-12-25 NOTE — ED Provider Notes (Signed)
CSN: [id number]     Arrival date & time 12/25/14  1313 History   First MD Initiated Contact with Patient 12/25/14 1519     Chief Complaint  Patient presents with  . Fall  . Back Injury     (Consider location/radiation/quality/duration/timing/severity/associated sxs/prior Treatment) Patient is a 24 y.o. male presenting with fall. The history is provided by the patient.  Fall This is a new problem. The current episode started more than 2 days ago. The problem occurs constantly. The problem has not changed since onset.Pertinent negatives include no chest pain, no abdominal pain, no headaches and no shortness of breath. Nothing aggravates the symptoms. Nothing relieves the symptoms. He has tried nothing for the symptoms. The treatment provided no relief.    24 year old male with a chief complaint of a fall. Patient was working on a roof and he fell 10-15 feet. This about 3 or 4 days ago. Patient has been unable to really get around at home. Has been taking a leftover prescription of Tylenol 3 with mild relief. Pain is mostly localized to the mid to lower thoracic spine. States that the pain radiates up into his neck and head. Denies any chest pain abdominal pain shortness breath. Patient denies any lower extremity weakness numbness denies any loss of bowel or bladder.  Past Medical History  Diagnosis Date  . Poor dental hygiene    Past Surgical History  Procedure Laterality Date  . Shoulder surgery      Right shoulder  . Shoulder surgery     No family history on file. Social History  Substance Use Topics  . Smoking status: Current Every Day Smoker -- 1.00 packs/day    Types: Cigarettes  . Smokeless tobacco: Current User    Types: Chew  . Alcohol Use: Yes     Comment: occasionally     Review of Systems  Constitutional: Negative for fever and chills.  HENT: Negative for congestion and facial swelling.   Eyes: Negative for discharge and visual disturbance.  Respiratory: Negative  for shortness of breath.   Cardiovascular: Negative for chest pain and palpitations.  Gastrointestinal: Negative for vomiting, abdominal pain and diarrhea.  Musculoskeletal: Positive for myalgias, back pain and arthralgias.  Skin: Negative for color change and rash.  Neurological: Negative for tremors, syncope and headaches.  Psychiatric/Behavioral: Negative for confusion and dysphoric mood.      Allergies  Review of patient's allergies indicates no known allergies.  Home Medications   Prior to Admission medications   Medication Sig Start Date End Date Taking? Authorizing Provider  acetaminophen-codeine (TYLENOL #3) 300-30 MG tablet Take 1 tablet by mouth every 4 (four) hours as needed for moderate pain.   Yes Historical Provider, MD  ibuprofen (ADVIL,MOTRIN) 800 MG tablet Take 800 mg by mouth every 8 (eight) hours as needed for moderate pain.   Yes Historical Provider, MD  amoxicillin (AMOXIL) 875 MG tablet Take 1 tablet (875 mg total) by mouth 2 (two) times daily. 08/26/14   Charm Rings, MD  HYDROcodone-acetaminophen (NORCO/VICODIN) 5-325 MG per tablet Take 1 tablet by mouth once. 08/26/14   Charm Rings, MD  naproxen (NAPROSYN) 500 MG tablet Take 1 tablet (500 mg total) by mouth 2 (two) times daily with a meal. 10/05/14   Antony Madura, PA-C  oxyCODONE-acetaminophen (PERCOCET) 5-325 MG per tablet Take 1 tablet by mouth every 4 (four) hours as needed. 06/02/14   Purvis Sheffield, MD   BP 119/64 mmHg  Pulse 60  Temp(Src) 98.2 F (36.8  C) (Oral)  Resp 16  SpO2 95% Physical Exam  Constitutional: He is oriented to person, place, and time. He appears well-developed and well-nourished.  HENT:  Head: Normocephalic and atraumatic.  Eyes: EOM are normal. Pupils are equal, round, and reactive to light.  Neck: Normal range of motion. Neck supple. No JVD present.  Cardiovascular: Normal rate and regular rhythm.  Exam reveals no gallop and no friction rub.   No murmur heard. Pulmonary/Chest: No  respiratory distress. He has no wheezes.  Abdominal: He exhibits no distension. There is no rebound and no guarding.  Musculoskeletal: Normal range of motion. He exhibits tenderness.  Neurological: He is alert and oriented to person, place, and time.  Skin: No rash noted. No pallor.  Psychiatric: He has a normal mood and affect. His behavior is normal.  Nursing note and vitals reviewed.   ED Course  Procedures (including critical care time) Labs Review Labs Reviewed - No data to display  Imaging Review Ct Cervical Spine Wo Contrast  12/25/2014  CLINICAL DATA:  Fall from roof 4 days ago, lower posterior neck and back pain. EXAM: CT CERVICAL, THORACIC, AND LUMBAR SPINE WITHOUT CONTRAST TECHNIQUE: Multidetector CT imaging of the cervical, thoracic and lumbar spine was performed without intravenous contrast. Multiplanar CT image reconstructions were also generated. COMPARISON:  None. FINDINGS: CT CERVICAL SPINE FINDINGS Normal alignment without acute osseous finding, fracture, subluxation or dislocation. Facets aligned. Preserved vertebral body heights and disc spaces. Normal prevertebral soft tissues. Odontoid is intact. No soft tissue asymmetry in the neck. Clear lung apices. CT THORACIC SPINE FINDINGS Normal thoracic spine alignment. Preserved vertebral heights and disc spaces. Facets appear aligned and intact. No focal kyphosis, wedge-shaped deformity or compression fracture. Normal paraspinal soft tissues. No Soft tissue abnormality. The paraspinous muscles appear symmetric and intact. Visualized lungs appear clear. Minor basilar hypoventilatory changes. CT LUMBAR SPINE FINDINGS Normal lumbar spine alignment. Preserved vertebral body heights and disc spaces. Facets aligned. No acute osseous finding or fracture. No pars defects. No soft tissue abnormality or asymmetry about the lumbar spine. No significant degenerative process or spondylosis. IMPRESSION: No acute fracture or malalignment of the  cervical, thoracic and lumbar spine by CT. Electronically Signed   By: Judie Petit.  Shick M.D.   On: 12/25/2014 17:36   Ct Thoracic Spine Wo Contrast  12/25/2014  CLINICAL DATA:  Fall from roof 4 days ago, lower posterior neck and back pain. EXAM: CT CERVICAL, THORACIC, AND LUMBAR SPINE WITHOUT CONTRAST TECHNIQUE: Multidetector CT imaging of the cervical, thoracic and lumbar spine was performed without intravenous contrast. Multiplanar CT image reconstructions were also generated. COMPARISON:  None. FINDINGS: CT CERVICAL SPINE FINDINGS Normal alignment without acute osseous finding, fracture, subluxation or dislocation. Facets aligned. Preserved vertebral body heights and disc spaces. Normal prevertebral soft tissues. Odontoid is intact. No soft tissue asymmetry in the neck. Clear lung apices. CT THORACIC SPINE FINDINGS Normal thoracic spine alignment. Preserved vertebral heights and disc spaces. Facets appear aligned and intact. No focal kyphosis, wedge-shaped deformity or compression fracture. Normal paraspinal soft tissues. No Soft tissue abnormality. The paraspinous muscles appear symmetric and intact. Visualized lungs appear clear. Minor basilar hypoventilatory changes. CT LUMBAR SPINE FINDINGS Normal lumbar spine alignment. Preserved vertebral body heights and disc spaces. Facets aligned. No acute osseous finding or fracture. No pars defects. No soft tissue abnormality or asymmetry about the lumbar spine. No significant degenerative process or spondylosis. IMPRESSION: No acute fracture or malalignment of the cervical, thoracic and lumbar spine by CT. Electronically Signed  By: Osvaldo ShipperM.  Shick M.D.   On: 12/25/2014 17:36   Ct Lumbar Spine Wo Contrast  12/25/2014  CLINICAL DATA:  Fall from roof 4 days ago, lower posterior neck and back pain. EXAM: CT CERVICAL, THORACIC, AND LUMBAR SPINE WITHOUT CONTRAST TECHNIQUE: Multidetector CT imaging of the cervical, thoracic and lumbar spine was performed without intravenous  contrast. Multiplanar CT image reconstructions were also generated. COMPARISON:  None. FINDINGS: CT CERVICAL SPINE FINDINGS Normal alignment without acute osseous finding, fracture, subluxation or dislocation. Facets aligned. Preserved vertebral body heights and disc spaces. Normal prevertebral soft tissues. Odontoid is intact. No soft tissue asymmetry in the neck. Clear lung apices. CT THORACIC SPINE FINDINGS Normal thoracic spine alignment. Preserved vertebral heights and disc spaces. Facets appear aligned and intact. No focal kyphosis, wedge-shaped deformity or compression fracture. Normal paraspinal soft tissues. No Soft tissue abnormality. The paraspinous muscles appear symmetric and intact. Visualized lungs appear clear. Minor basilar hypoventilatory changes. CT LUMBAR SPINE FINDINGS Normal lumbar spine alignment. Preserved vertebral body heights and disc spaces. Facets aligned. No acute osseous finding or fracture. No pars defects. No soft tissue abnormality or asymmetry about the lumbar spine. No significant degenerative process or spondylosis. IMPRESSION: No acute fracture or malalignment of the cervical, thoracic and lumbar spine by CT. Electronically Signed   By: Judie PetitM.  Shick M.D.   On: 12/25/2014 17:36   I have personally reviewed and evaluated these images and lab results as part of my medical decision-making.   EKG Interpretation None      MDM   Final diagnoses:  Midline thoracic back pain    24 yo M with a chief complaint of a fall from a roof. No noted crepitus the patient has pain worst about T6-8. Will obtain a CT scan of the CT and L-spine to rule out fracture.  CT scan of the CT and L-spine all negative. Discharged patient home NSAIDs for pain.    I have discussed the diagnosis/risks/treatment options with the patient and family and believe the pt to be eligible for discharge home to follow-up with PCP. We also discussed returning to the ED immediately if new or worsening sx  occur. We discussed the sx which are most concerning (e.g., sudden worsening pain, fever, cauda equina) that necessitate immediate return. Medications administered to the patient during their visit and any new prescriptions provided to the patient are listed below.  Medications given during this visit Medications  acetaminophen (TYLENOL) tablet 1,000 mg (1,000 mg Oral Given 12/25/14 1540)  ibuprofen (ADVIL,MOTRIN) tablet 800 mg (800 mg Oral Given 12/25/14 1540)  oxyCODONE (Oxy IR/ROXICODONE) immediate release tablet 5 mg (5 mg Oral Given 12/25/14 1541)    Discharge Medication List as of 12/25/2014  5:46 PM      The patient appears reasonably screen and/or stabilized for discharge and I doubt any other medical condition or other [facility] requiring further screening, evaluation, or treatment in the ED at this time prior to discharge.    Melene Planan Shelbey Spindler, DO 12/25/14 2320

## 2014-12-25 NOTE — ED Notes (Signed)
Pt reports falling from roof 10-15 ft x 3-4 days, pt reports ambulatory post fall, pt reports upper & mid back pain, denies hematuria, pt reports R toe numbness, pt reports shooting pain into  Neck, A&O x4, denies hitting head,denies -LOC

## 2014-12-25 NOTE — Discharge Instructions (Signed)
Take 4 over the counter ibuprofen tablets 3 times a day or 2 over-the-counter naproxen tablets twice a day for pain. ° °Back Pain, Adult °Back pain is very common. The pain often gets better over time. The cause of back pain is usually not dangerous. Most people can learn to manage their back pain on their own.  °HOME CARE  °Watch your back pain for any changes. The following actions may help to lessen any pain you are feeling: °· Stay active. Start with short walks on flat ground if you can. Try to walk farther each day. °· Exercise regularly as told by your doctor. Exercise helps your back heal faster. It also helps avoid future injury by keeping your muscles strong and flexible. °· Do not sit, drive, or stand in one place for more than 30 minutes. °· Do not stay in bed. Resting more than 1-2 days can slow down your recovery. °· Be careful when you bend or lift an object. Use good form when lifting: °¨ Bend at your knees. °¨ Keep the object close to your body. °¨ Do not twist. °· Sleep on a firm mattress. Lie on your side, and bend your knees. If you lie on your back, put a pillow under your knees. °· Take medicines only as told by your doctor. °· Put ice on the injured area. °¨ Put ice in a plastic bag. °¨ Place a towel between your skin and the bag. °¨ Leave the ice on for 20 minutes, 2-3 times a day for the first 2-3 days. After that, you can switch between ice and heat packs. °· Avoid feeling anxious or stressed. Find good ways to deal with stress, such as exercise. °· Maintain a healthy weight. Extra weight puts stress on your back. °GET HELP IF:  °· You have pain that does not go away with rest or medicine. °· You have worsening pain that goes down into your legs or buttocks. °· You have pain that does not get better in one week. °· You have pain at night. °· You lose weight. °· You have a fever or chills. °GET HELP RIGHT AWAY IF:  °· You cannot control when you poop (bowel movement) or pee  (urinate). °· Your arms or legs feel weak. °· Your arms or legs lose feeling (numbness). °· You feel sick to your stomach (nauseous) or throw up (vomit). °· You have belly (abdominal) pain. °· You feel like you may pass out (faint). °  °This information is not intended to replace advice given to you by your health care provider. Make sure you discuss any questions you have with your health care provider. °  °Document Released: 06/24/2007 Document Revised: 01/26/2014 Document Reviewed: 05/09/2013 °Elsevier Interactive Patient Education ©2016 [employer] ° °

## 2015-03-08 ENCOUNTER — Encounter (HOSPITAL_COMMUNITY): Payer: Self-pay | Admitting: Family Medicine

## 2015-03-08 ENCOUNTER — Emergency Department (HOSPITAL_COMMUNITY)
Admission: EM | Admit: 2015-03-08 | Discharge: 2015-03-08 | Disposition: A | Discharge status: Home / Self Care | Payer: Self-pay | Attending: Emergency Medicine | Admitting: Emergency Medicine

## 2015-03-08 ENCOUNTER — Emergency Department (HOSPITAL_COMMUNITY): Payer: Self-pay

## 2015-03-08 DIAGNOSIS — Y9289 Other specified places as the place of occurrence of the external cause: Secondary | ICD-10-CM | POA: Insufficient documentation

## 2015-03-08 DIAGNOSIS — Z79899 Other long term (current) drug therapy: Secondary | ICD-10-CM | POA: Insufficient documentation

## 2015-03-08 DIAGNOSIS — F17210 Nicotine dependence, cigarettes, uncomplicated: Secondary | ICD-10-CM | POA: Insufficient documentation

## 2015-03-08 DIAGNOSIS — S01111A Laceration without foreign body of right eyelid and periocular area, initial encounter: Secondary | ICD-10-CM | POA: Insufficient documentation

## 2015-03-08 DIAGNOSIS — Y999 Unspecified external cause status: Secondary | ICD-10-CM | POA: Insufficient documentation

## 2015-03-08 DIAGNOSIS — Z9189 Other specified personal risk factors, not elsewhere classified: Secondary | ICD-10-CM | POA: Insufficient documentation

## 2015-03-08 DIAGNOSIS — Z792 Long term (current) use of antibiotics: Secondary | ICD-10-CM | POA: Insufficient documentation

## 2015-03-08 DIAGNOSIS — Y9389 Activity, other specified: Secondary | ICD-10-CM | POA: Insufficient documentation

## 2015-03-08 MED ORDER — ONDANSETRON 4 MG PO TBDP
4.0000 mg | ORAL_TABLET | Freq: Once | ORAL | Status: AC
  Administered 2015-03-08: 4 mg via ORAL
  Filled 2015-03-08: qty 1

## 2015-03-08 MED ORDER — ACETAMINOPHEN 500 MG PO TABS
1000.0000 mg | ORAL_TABLET | Freq: Once | ORAL | Status: AC
  Administered 2015-03-08: 1000 mg via ORAL
  Filled 2015-03-08: qty 2

## 2015-03-08 MED ORDER — LIDOCAINE-EPINEPHRINE (PF) 2 %-1:200000 IJ SOLN
10.0000 mL | Freq: Once | INTRAMUSCULAR | Status: DC
  Filled 2015-03-08: qty 10
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
## 2015-03-08 NOTE — ED Notes (Signed)
Pt discharged with paperwork and follow-up instructions discussed. Verbalized understanding.
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
## 2015-03-08 NOTE — Discharge Instructions (Signed)
Facial Laceration ° A facial laceration is a cut on the face. These injuries can be painful and cause bleeding. Lacerations usually heal quickly, but they need special care to reduce scarring. °DIAGNOSIS  °Your health care provider will take a medical history, ask for details about how the injury occurred, and examine the wound to determine how deep the cut is. °TREATMENT  °Some facial lacerations may not require closure. Others may not be able to be closed because of an increased risk of infection. The risk of infection and the chance for successful closure will depend on various factors, including the amount of time since the injury occurred. °The wound may be cleaned to help prevent infection. If closure is appropriate, pain medicines may be given if needed. Your health care provider will use stitches (sutures), wound glue (adhesive), or skin adhesive strips to repair the laceration. These tools bring the skin edges together to allow for faster healing and a better cosmetic outcome. If needed, you may also be given a tetanus shot. °HOME CARE INSTRUCTIONS °· Only take over-the-counter or prescription medicines as directed by your health care provider. °· Follow your health care provider's instructions for wound care. These instructions will vary depending on the technique used for closing the wound. °For Sutures: °· Keep the wound clean and dry.   °· If you were given a bandage (dressing), you should change it at least once a day. Also change the dressing if it becomes wet or dirty, or as directed by your health care provider.   °· Wash the wound with soap and water 2 times a day. Rinse the wound off with water to remove all soap. Pat the wound dry with a clean towel.   °· After cleaning, apply a thin layer of the antibiotic ointment recommended by your health care provider. This will help prevent infection and keep the dressing from sticking.   °· You may shower as usual after the first 24 hours. Do not soak the  wound in water until the sutures are removed.   °· Get your sutures removed as directed by your health care provider. With facial lacerations, sutures should usually be taken out after 4-5 days to avoid stitch marks.   °· Wait a few days after your sutures are removed before applying any makeup. °For Skin Adhesive Strips: °· Keep the wound clean and dry.   °· Do not get the skin adhesive strips wet. You may bathe carefully, using caution to keep the wound dry.   °· If the wound gets wet, pat it dry with a clean towel.   °· Skin adhesive strips will fall off on their own. You may trim the strips as the wound heals. Do not remove skin adhesive strips that are still stuck to the wound. They will fall off in time.   °For Wound Adhesive: °· You may briefly wet your wound in the shower or bath. Do not soak or scrub the wound. Do not swim. Avoid periods of heavy sweating until the skin adhesive has fallen off on its own. After showering or bathing, gently pat the wound dry with a clean towel.   °· Do not apply liquid medicine, cream medicine, ointment medicine, or makeup to your wound while the skin adhesive is in place. This may loosen the film before your wound is healed.   °· If a dressing is placed over the wound, be careful not to apply tape directly over the skin adhesive. This may cause the adhesive to be pulled off before the wound is healed.   °· Avoid   prolonged exposure to sunlight or tanning lamps while the skin adhesive is in place. °· The skin adhesive will usually remain in place for 5-10 days, then naturally fall off the skin. Do not pick at the adhesive film.   °After Healing: °Once the wound has healed, cover the wound with sunscreen during the day for 1 full year. This can help minimize scarring. Exposure to ultraviolet light in the first year will darken the scar. It can take 1-2 years for the scar to lose its redness and to heal completely.  °SEEK MEDICAL CARE IF: °· You have a fever. °SEEK IMMEDIATE  MEDICAL CARE IF: °· You have redness, pain, or swelling around the wound.   °· You see a yellowish-white fluid (pus) coming from the wound.   °  °This information is not intended to replace advice given to you by your health care provider. Make sure you discuss any questions you have with your health care provider. °  °Document Released: 02/13/2004 Document Revised: 01/26/2014 Document Reviewed: 08/18/2012 °Elsevier Interactive Patient Education ©2016 [employer] ° °

## 2015-06-19 ENCOUNTER — Ambulatory Visit (HOSPITAL_COMMUNITY)
Admission: EM | Admit: 2015-06-19 | Discharge: 2015-06-19 | Disposition: A | Discharge status: Home / Self Care | Payer: Self-pay | Attending: Family Medicine | Admitting: Family Medicine

## 2015-06-19 ENCOUNTER — Encounter (HOSPITAL_COMMUNITY): Payer: Self-pay | Admitting: *Deleted

## 2015-06-19 DIAGNOSIS — R590 Localized enlarged lymph nodes: Secondary | ICD-10-CM

## 2015-06-19 DIAGNOSIS — R599 Enlarged lymph nodes, unspecified: Secondary | ICD-10-CM

## 2015-06-19 DIAGNOSIS — W57XXXA Bitten or stung by nonvenomous insect and other nonvenomous arthropods, initial encounter: Secondary | ICD-10-CM

## 2015-06-19 DIAGNOSIS — N509 Disorder of male genital organs, unspecified: Secondary | ICD-10-CM

## 2015-06-19 DIAGNOSIS — T148 Other injury of unspecified body region: Secondary | ICD-10-CM

## 2015-06-19 MED ORDER — DOXYCYCLINE HYCLATE 100 MG PO CAPS: 100.0000 mg | ORAL_CAPSULE | Freq: Two times a day (BID) | ORAL | Status: DC

## 2015-06-19 NOTE — Discharge Instructions (Signed)
Tick Bite Information For any itching or burning around the tick bite site. Apply Benadryl gel and/or hydrocortisone 1% cream. Ticks are insects that attach themselves to the skin and draw blood for food. There are various types of ticks. Common types include wood ticks and deer ticks. Most ticks live in shrubs and grassy areas. Ticks can climb onto your body when you make contact with leaves or grass where the tick is waiting. The most common places on the body for ticks to attach themselves are the scalp, neck, armpits, waist, and groin. Most tick bites are harmless, but sometimes ticks carry germs that cause diseases. These germs can be spread to a person during the tick's feeding process. The chance of a disease spreading through a tick bite depends on:   The type of tick.  Time of year.   How long the tick is attached.   Geographic location.  HOW CAN YOU PREVENT TICK BITES? Take these steps to help prevent tick bites when you are outdoors:  Wear protective clothing. Long sleeves and long pants are best.   Wear white clothes so you can see ticks more easily.  Tuck your pant legs into your socks.   If walking on a trail, stay in the middle of the trail to avoid brushing against bushes.  Avoid walking through areas with long grass.  Put insect repellent on all exposed skin and along boot tops, pant legs, and sleeve cuffs.   Check clothing, hair, and skin repeatedly and before going inside.   Brush off any ticks that are not attached.  Take a shower or bath as soon as possible after being outdoors.  WHAT IS THE PROPER WAY TO REMOVE A TICK? Ticks should be removed as soon as possible to help prevent diseases caused by tick bites.  If latex gloves are available, put them on before trying to remove a tick.   Using fine-point tweezers, grasp the tick as close to the skin as possible. You may also use curved forceps or a tick removal tool. Grasp the tick as close to its  head as possible. Avoid grasping the tick on its body.  Pull gently with steady upward pressure until the tick lets go. Do not twist the tick or jerk it suddenly. This may break off the tick's head or mouth parts.  Do not squeeze or crush the tick's body. This could force disease-carrying fluids from the tick into your body.   After the tick is removed, wash the bite area and your hands with soap and water or other disinfectant such as alcohol.  Apply a small amount of antiseptic cream or ointment to the bite site.   Wash and disinfect any instruments that were used.  Do not try to remove a tick by applying a hot match, petroleum jelly, or fingernail polish to the tick. These methods do not work and may increase the chances of disease being spread from the tick bite.  WHEN SHOULD YOU SEEK MEDICAL CARE? Contact your health care provider if you are unable to remove a tick from your skin or if a part of the tick breaks off and is stuck in the skin.  After a tick bite, you need to be aware of signs and symptoms that could be related to diseases spread by ticks. Contact your health care provider if you develop any of the following in the days or weeks after the tick bite:  Unexplained fever.  Rash. A circular rash that appears days  or weeks after the tick bite may indicate the possibility of Lyme disease. The rash may resemble a target with a bull's-eye and may occur at a different part of your body than the tick bite.  Redness and swelling in the area of the tick bite.   Tender, swollen lymph glands.   Diarrhea.   Weight loss.   Cough.   Fatigue.   Muscle, joint, or bone pain.   Abdominal pain.   Headache.   Lethargy or a change in your level of consciousness.  Difficulty walking or moving your legs.   Numbness in the legs.   Paralysis.  Shortness of breath.   Confusion.   Repeated vomiting.    This information is not intended to replace advice given  to you by your health care provider. Make sure you discuss any questions you have with your health care provider.   Document Released: 01/03/2000 Document Revised: 01/26/2014 Document Reviewed: 06/15/2012 Elsevier Interactive Patient Education 2016 [employer]  Lymphadenopathy Lymphadenopathy refers to swollen or enlarged lymph glands, also called lymph nodes. Lymph glands are part of your body's defense (immune) system, which protects the body from infections, germs, and diseases. Lymph glands are found in many locations in your body, including the neck, underarm, and groin.  Many things can cause lymph glands to become enlarged. When your immune system responds to germs, such as viruses or bacteria, infection-fighting cells and fluid build up. This causes the glands to grow in size. Usually, this is not something to worry about. The swelling and any soreness often go away without treatment. However, swollen lymph glands can also be caused by a number of diseases. Your health care provider may do various tests to help determine the cause. If the cause of your swollen lymph glands cannot be found, it is important to monitor your condition to make sure the swelling goes away. HOME CARE INSTRUCTIONS Watch your condition for any changes. The following actions may help to lessen any discomfort you are feeling:  Get plenty of rest.  Take medicines only as directed by your health care provider. Your health care provider may recommend over-the-counter medicines for pain.  Apply moist heat compresses to the site of swollen lymph nodes as directed by your health care provider. This can help reduce any pain.  Check your lymph nodes daily for any changes.  Keep all follow-up visits as directed by your health care provider. This is important. SEEK MEDICAL CARE IF:  Your lymph nodes are still swollen after 2 weeks.  Your swelling increases or spreads to other areas.  Your lymph nodes are hard, seem  fixed to the skin, or are growing rapidly.  Your skin over the lymph nodes is red and inflamed.  You have a fever.  You have chills.  You have fatigue.  You develop a sore throat.  You have abdominal pain.  You have weight loss.  You have night sweats. SEEK IMMEDIATE MEDICAL CARE IF:  You notice fluid leaking from the area of the enlarged lymph node.  You have severe pain in any area of your body.  You have chest pain.  You have shortness of breath.   This information is not intended to replace advice given to you by your health care provider. Make sure you discuss any questions you have with your health care provider.   Document Released: 10/15/2007 Document Revised: 01/26/2014 Document Reviewed: 08/10/2013 Elsevier Interactive Patient Education 2016 [employer]  Testicular Self-Exam A self-exam of your testicles  is looking at and feeling your testicles for abnormal lumps or swelling. Several things can cause swelling, lumps, or pain in your testicles. Some of these causes are:  Injuries.  Puffiness, redness, and soreness (inflammation).  Infection.  Extra fluids around your testicle.  Twisted testicles.  Testicular cancer. The testicles are easiest to check after warm baths or showers. They are harder to examine when you are cold.  Follow these steps while you are standing:  Hold your penis away from your body.  Roll one testicle between your thumb and finger. Feel the entire testicle.  Roll the other testicle between your thumb and finger. Feel the entire testicle. Feel for lumps, swelling, or discomfort. A normal testicle is egg shaped. It feels firm. It is smooth and not tender. The spermatic cord feels like a firm spaghetti-like cord. It is at the back of your testicle. Examine the crease between the front of your leg and your abdomen. Feel for any bumps that are tender. These could be enlarged lymph nodes.    This information is not intended to  replace advice given to you by your health care provider. Make sure you discuss any questions you have with your health care provider.   Document Released: 04/03/2008 Document Revised: 10/26/2012 Document Reviewed: 06/27/2012 Elsevier Interactive Patient Education [employer]
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
## 2015-06-19 NOTE — ED Notes (Signed)
Pt  States     He  Noticed a  Tick  On  His   l    Inguinal    Area     sev  Days ago  He  Reports  He removed  The tick  Himself  But  May  Have left     Some of it  In  Place       Pt  Reports  Some  Redness   And pain   With  Swelling     sev  Days ago

## 2015-09-26 ENCOUNTER — Encounter (HOSPITAL_COMMUNITY): Payer: Self-pay | Admitting: Emergency Medicine

## 2015-09-26 ENCOUNTER — Emergency Department (HOSPITAL_COMMUNITY): Payer: BLUE CROSS/BLUE SHIELD

## 2015-09-26 ENCOUNTER — Emergency Department (HOSPITAL_COMMUNITY)
Admission: EM | Admit: 2015-09-26 | Discharge: 2015-09-26 | Disposition: A | Discharge status: Home / Self Care | Payer: BLUE CROSS/BLUE SHIELD | Attending: Emergency Medicine | Admitting: Emergency Medicine

## 2015-09-26 DIAGNOSIS — Z79899 Other long term (current) drug therapy: Secondary | ICD-10-CM | POA: Diagnosis not present

## 2015-09-26 DIAGNOSIS — R1011 Right upper quadrant pain: Secondary | ICD-10-CM | POA: Insufficient documentation

## 2015-09-26 DIAGNOSIS — F17210 Nicotine dependence, cigarettes, uncomplicated: Secondary | ICD-10-CM | POA: Insufficient documentation

## 2015-09-26 DIAGNOSIS — K852 Alcohol induced acute pancreatitis without necrosis or infection: Secondary | ICD-10-CM | POA: Diagnosis not present

## 2015-09-26 DIAGNOSIS — R112 Nausea with vomiting, unspecified: Secondary | ICD-10-CM | POA: Diagnosis present

## 2015-09-26 LAB — CBC WITH DIFFERENTIAL/PLATELET
Basophils Absolute: 0 10*3/uL (ref 0.0–0.1)
Basophils Relative: 0 %
Eosinophils Absolute: 0.2 10*3/uL (ref 0.0–0.7)
Eosinophils Relative: 1 %
HCT: 47.1 % (ref 39.0–52.0)
Hemoglobin: 16.3 g/dL (ref 13.0–17.0)
Lymphocytes Relative: 26 %
Lymphs Abs: 4.1 10*3/uL — ABNORMAL HIGH (ref 0.7–4.0)
MCH: 31.3 pg (ref 26.0–34.0)
MCHC: 34.6 g/dL (ref 30.0–36.0)
MCV: 90.6 fL (ref 78.0–100.0)
Monocytes Absolute: 1.3 10*3/uL — ABNORMAL HIGH (ref 0.1–1.0)
Monocytes Relative: 8 %
Neutro Abs: 10.1 10*3/uL — ABNORMAL HIGH (ref 1.7–7.7)
Neutrophils Relative %: 65 %
Platelets: 233 10*3/uL (ref 150–400)
RBC: 5.2 MIL/uL (ref 4.22–5.81)
RDW: 12.9 % (ref 11.5–15.5)
WBC: 15.8 10*3/uL — ABNORMAL HIGH (ref 4.0–10.5)

## 2015-09-26 LAB — URINALYSIS, ROUTINE W REFLEX MICROSCOPIC
BILIRUBIN URINE: NEGATIVE
Glucose, UA: NEGATIVE mg/dL
HGB URINE DIPSTICK: NEGATIVE
Ketones, ur: NEGATIVE mg/dL
Leukocytes, UA: NEGATIVE
Nitrite: NEGATIVE
Protein, ur: NEGATIVE mg/dL
SPECIFIC GRAVITY, URINE: 1.027 (ref 1.005–1.030)
pH: 7.5 (ref 5.0–8.0)

## 2015-09-26 LAB — COMPREHENSIVE METABOLIC PANEL
ALK PHOS: 55 U/L (ref 38–126)
ALT: 159 U/L — AB (ref 17–63)
AST: 111 U/L — ABNORMAL HIGH (ref 15–41)
Albumin: 4.1 g/dL (ref 3.5–5.0)
Anion gap: 11 (ref 5–15)
BILIRUBIN TOTAL: 0.7 mg/dL (ref 0.3–1.2)
BUN: 12 mg/dL (ref 6–20)
CALCIUM: 9.4 mg/dL (ref 8.9–10.3)
CO2: 27 mmol/L (ref 22–32)
CREATININE: 0.93 mg/dL (ref 0.61–1.24)
Chloride: 103 mmol/L (ref 101–111)
Glucose, Bld: 107 mg/dL — ABNORMAL HIGH (ref 65–99)
Potassium: 3.8 mmol/L (ref 3.5–5.1)
SODIUM: 141 mmol/L (ref 135–145)
TOTAL PROTEIN: 7.1 g/dL (ref 6.5–8.1)

## 2015-09-26 LAB — LIPASE, BLOOD: Lipase: 84 U/L — ABNORMAL HIGH (ref 11–51)

## 2015-09-26 MED ORDER — HYDROCODONE-ACETAMINOPHEN 5-325 MG PO TABS: 1.0000 | ORAL_TABLET | ORAL | 0 refills | Status: DC | PRN

## 2015-09-26 MED ORDER — SODIUM CHLORIDE 0.9 % IV BOLUS (SEPSIS)
1000.0000 mL | Freq: Once | INTRAVENOUS | Status: AC
  Administered 2015-09-26: 1000 mL via INTRAVENOUS

## 2015-09-26 MED ORDER — IOPAMIDOL (ISOVUE-300) INJECTION 61%
INTRAVENOUS | Status: AC
  Administered 2015-09-26: 100 mL
  Filled 2015-09-26: qty 100

## 2015-09-26 MED ORDER — ONDANSETRON HCL 4 MG/2ML IJ SOLN
4.0000 mg | Freq: Once | INTRAMUSCULAR | Status: AC
  Administered 2015-09-26: 4 mg via INTRAVENOUS
  Filled 2015-09-26: qty 2

## 2015-09-26 MED ORDER — HYDROMORPHONE HCL 1 MG/ML IJ SOLN
2.0000 mg | Freq: Once | INTRAMUSCULAR | Status: AC
  Administered 2015-09-26: 2 mg via INTRAVENOUS
  Filled 2015-09-26: qty 2

## 2015-09-26 MED ORDER — HYDROMORPHONE HCL 1 MG/ML IJ SOLN
1.0000 mg | Freq: Once | INTRAMUSCULAR | Status: AC
  Administered 2015-09-26: 1 mg via INTRAVENOUS
  Filled 2015-09-26: qty 1

## 2015-09-26 NOTE — Discharge Instructions (Signed)
DO NOT DRINK ANY ALCOHOL.

## 2015-09-26 NOTE — ED Provider Notes (Signed)
[facility] Provider Note   CSN: [id number] Arrival date & time: 09/26/15  0418     History   Chief Complaint Chief Complaint  Patient presents with  . Abdominal Pain  . Chest Pain    HPI Charles Gardner is a 25 y.o. male.  Patient presents to the emergency room with complaints of abdominal pain with nausea and vomiting. Patient reports that he has nausea and vomiting to the course of the day. He has not been able to hold anything down. He had been vomiting for a large part of the day when he suddenly developed right upper quadrant and right lower rib pain. Patient reports sharp, stabbing and severe pain in this area.      Past Medical History:  Diagnosis Date  . Poor dental hygiene     Patient Active Problem List   Diagnosis Date Noted  . Poor dental hygiene     Past Surgical History:  Procedure Laterality Date  . SHOULDER SURGERY     Right shoulder  . SHOULDER SURGERY         Home Medications    Prior to Admission medications   Medication Sig Start Date End Date Taking? Authorizing Provider  doxycycline (VIBRAMYCIN) 100 MG capsule Take 1 capsule (100 mg total) by mouth 2 (two) times daily. Patient not taking: Reported on 09/26/2015 06/19/15   Hayden Rasmussen, NP    Family History History reviewed. No pertinent family history.  Social History Social History  Substance Use Topics  . Smoking status: Current Every Day Smoker    Packs/day: 1.00    Types: Cigarettes  . Smokeless tobacco: Current User    Types: Chew  . Alcohol use Yes     Comment: occasionally      Allergies   Review of patient's allergies indicates no known allergies.   Review of Systems Review of Systems  Gastrointestinal: Positive for abdominal pain, nausea and vomiting.  All other systems reviewed and are negative.    Physical Exam Updated Vital Signs BP 131/83   Pulse 78   Temp 98 F (36.7 C) (Oral)   Resp 18   Ht 5\' 7"  (1.702 m)   Wt 170 lb (77.1 kg)   SpO2 96%    BMI 26.63 kg/m   Physical Exam  Constitutional: He is oriented to person, place, and time. He appears well-developed and well-nourished. No distress.  HENT:  Head: Normocephalic and atraumatic.  Right Ear: Hearing normal.  Left Ear: Hearing normal.  Nose: Nose normal.  Mouth/Throat: Oropharynx is clear and moist and mucous membranes are normal.  Eyes: Conjunctivae and EOM are normal. Pupils are equal, round, and reactive to light.  Neck: Normal range of motion. Neck supple.  Cardiovascular: Regular rhythm, S1 normal and S2 normal.  Exam reveals no gallop and no friction rub.   No murmur heard. Pulmonary/Chest: Effort normal and breath sounds normal. No respiratory distress. He exhibits no tenderness.  Abdominal: Soft. Normal appearance and bowel sounds are normal. There is no hepatosplenomegaly. There is tenderness in the right upper quadrant. There is no rebound, no guarding, no tenderness at McBurney's point and negative Murphy's sign. No hernia.  Musculoskeletal: Normal range of motion.  Neurological: He is alert and oriented to person, place, and time. He has normal strength. No cranial nerve deficit or sensory deficit. Coordination normal. GCS eye subscore is 4. GCS verbal subscore is 5. GCS motor subscore is 6.  Skin: Skin is warm, dry and intact. No rash noted. No  cyanosis.  Psychiatric: He has a normal mood and affect. His speech is normal and behavior is normal. Thought content normal.  Nursing note and vitals reviewed.    ED Treatments / Results  Labs (all labs ordered are listed, but only abnormal results are displayed) Labs Reviewed  CBC WITH DIFFERENTIAL/PLATELET - Abnormal; Notable for the following:       Result Value   WBC 15.8 (*)    Neutro Abs 10.1 (*)    Lymphs Abs 4.1 (*)    Monocytes Absolute 1.3 (*)    All other components within normal limits  COMPREHENSIVE METABOLIC PANEL - Abnormal; Notable for the following:    Glucose, Bld 107 (*)    AST 111 (*)     ALT 159 (*)    All other components within normal limits  LIPASE, BLOOD - Abnormal; Notable for the following:    Lipase 84 (*)    All other components within normal limits  URINALYSIS, ROUTINE W REFLEX MICROSCOPIC (NOT AT [facility])    EKG  EKG Interpretation None       Radiology Ct Abdomen Pelvis W Contrast  Result Date: 09/26/2015 CLINICAL DATA:  Right upper quadrant pain. Vomiting. Elevated white blood count. Mildly elevated lipase. EXAM: CT ABDOMEN AND PELVIS WITH CONTRAST TECHNIQUE: Multidetector CT imaging of the abdomen and pelvis was performed using the standard protocol following bolus administration of intravenous contrast. CONTRAST:  ISOVUE-300 IOPAMIDOL (ISOVUE-300) INJECTION 61% COMPARISON:  Ultrasound 09/26/2015 FINDINGS: Lower chest:  Negative Hepatobiliary: The liver gallbladder and bile ducts normal. Pancreas: Mild amount of edema near the uncinate process and duodenum. Remainder the pancreas normal. Spleen: Negative Adrenals/Urinary Tract: Normal kidneys. No renal mass or obstruction or stone. Symmetric excretion of contrast. Urinary bladder normal. Stomach/Bowel: Normal stomach. Duodenum is distended and fluid-filled. There is retroperitoneal edema around the second and third portion of the duodenum. No duodenal mucosal edema or ulcer identified. The proximal jejunum also is mildly distended with fluid filled bowel in small air-fluid levels. Jejunum measures up to 3 cm in diameter. The ileum is fluid filled but not dilated. No obstructing mass or hernia is identified. Colon decompressed. Normal appendix. Vascular/Lymphatic: Aorta and IVC normal.  No lymphadenopathy. Reproductive: Normal prostate Other: Negative for abscess.  No free intraperitoneal fluid. Musculoskeletal: Negative IMPRESSION: Mild amount of edema around the second and third portions of the duodenum and pancreatic head. Mildly distended fluid-filled did duodenum and proximal jejunum. Favor ileus however  mechanical obstruction of the jejunum not excluded. Possible pancreatitis or duodenitis. Enteritis is possible. Normal appendix. Electronically Signed   By: Marlan Palau M.D.   On: 09/26/2015 07:34   Dg Abd Acute W/chest  Result Date: 09/26/2015 CLINICAL DATA:  Acute onset of right upper quadrant abdominal pain. Initial encounter. EXAM: DG ABDOMEN ACUTE W/ 1V CHEST COMPARISON:  None. FINDINGS: The lungs are well-aerated and clear. There is no evidence of focal opacification, pleural effusion or pneumothorax. The cardiomediastinal silhouette is within normal limits. The visualized bowel gas pattern is unremarkable. Scattered stool and air are seen within the colon; there is no evidence of small bowel dilatation to suggest obstruction. No free intra-abdominal air is identified on the provided upright view. No acute osseous abnormalities are seen; the sacroiliac joints are unremarkable in appearance. IMPRESSION: 1. Unremarkable bowel gas pattern; no free intra-abdominal air seen. Moderate amount of stool noted in the colon. 2. No acute cardiopulmonary process seen. Electronically Signed   By: Roanna Raider M.D.   On: 09/26/2015 05:11  [place] Abdomen Limited Ruq  Result Date: 09/26/2015 CLINICAL DATA:  Acute onset of right upper quadrant abdominal pain. Initial encounter. EXAM: US ABDOMEN LIMITED - RIGHT UPPER QUADRANT COMPARISON:  None. FINDINGS: Gallbladder: No gallstones or wall thickening visualized. No sonographic Murphy sign noted by sonographer. Common bile duct: Diameter: 0.3 cm, within normal limits in caliber. Liver: No focal lesion identified. Within normal limits in parenchymal echogenicity. IMPRESSION: Unremarkable ultrasound of the right upper quadrant. Electronically Signed   By: Roanna RaiderJeffery  Chang M.D.   On: 09/26/2015 05:24    Procedures Procedures (including critical care time)  Medications Ordered in ED Medications  HYDROmorphone (DILAUDID) injection 1 mg (1 mg Intravenous Given 09/26/15 0433)   ondansetron (ZOFRAN) injection 4 mg (4 mg Intravenous Given 09/26/15 0433)  HYDROmorphone (DILAUDID) injection 2 mg (2 mg Intravenous Given 09/26/15 0613)  sodium chloride 0.9 % bolus 1,000 mL (0 mLs Intravenous Stopped 09/26/15 0707)  iopamidol (ISOVUE-300) 61 % injection (100 mLs  Contrast Given 09/26/15 0645)     Initial Impression / Assessment and Plan / ED Course  I have reviewed the triage vital signs and the nursing notes.  Pertinent labs & imaging results that were available during my care of the patient were reviewed by me and considered in my medical decision making (see chart for details).  Clinical Course    Patient presents to the emergency part for evaluation of upper abdominal pain. Patient did have epigastric and right upper quadrant tenderness on examination. He seems to be improved after IV analgesia and fluids. Lab work reveals mildly elevated LFTs and lipase. Ultrasound of right upper quadrant was performed at arrival to rule out gallbladder disease and none was seen. He has a normal common bile duct. CT scan was performed to further evaluate. He does have mild edema in the area of the pancreas and duodenum. Further discussion with the patient reveals that he does drink excessively daily and this is likely alcoholic gastritis and pancreatitis. As he is improved I do not believe he requires hospitalization. He was counseled that he needs to avoid all alcohol intake will be treated symptomatically, follow-up with gastroenterology. Return for worsening symptoms.  Final Clinical Impressions(s) / ED Diagnoses   Final diagnoses:  Pancreatitis, alcoholic, acute    New Prescriptions New Prescriptions   No medications on file     Gilda Creasehristopher J Pollina, MD 09/26/15 [phone]

## 2015-09-26 NOTE — ED Notes (Signed)
Pt returned from radiology at this time via ED stretcher. Pt in no apparent distress at this time.   

## 2015-09-26 NOTE — ED Triage Notes (Signed)
Pt arrives to [facility] at this time via [facility].  Pt reports that he has had nausea and vomiting x 6 episodes in the last 24 hours.  Pt rates his pain 8/10.   Chief Complaint  Patient presents with  . Abdominal Pain  . Chest Pain   Past Medical History:  Diagnosis Date  . Poor dental hygiene

## 2015-09-27 ENCOUNTER — Other Ambulatory Visit: Payer: Self-pay | Admitting: Orthopedic Surgery

## 2015-10-07 ENCOUNTER — Ambulatory Visit (HOSPITAL_BASED_OUTPATIENT_CLINIC_OR_DEPARTMENT_OTHER): Admit: 2015-10-07 | Payer: BLUE CROSS/BLUE SHIELD | Admitting: Orthopedic Surgery

## 2015-10-07 ENCOUNTER — Encounter (HOSPITAL_BASED_OUTPATIENT_CLINIC_OR_DEPARTMENT_OTHER): Payer: Self-pay

## 2015-10-07 SURGERY — WRIST FUSION WITH ILIAC CREST BONE GRAFT
Anesthesia: General | Site: Wrist | Laterality: Left

## 2016-03-11 ENCOUNTER — Ambulatory Visit (HOSPITAL_COMMUNITY)
Admission: EM | Admit: 2016-03-11 | Discharge: 2016-03-11 | Disposition: A | Discharge status: Home / Self Care | Payer: BLUE CROSS/BLUE SHIELD | Attending: Family Medicine | Admitting: Family Medicine

## 2016-03-11 ENCOUNTER — Ambulatory Visit (INDEPENDENT_AMBULATORY_CARE_PROVIDER_SITE_OTHER): Payer: Self-pay

## 2016-03-11 ENCOUNTER — Encounter (HOSPITAL_COMMUNITY): Payer: Self-pay | Admitting: Emergency Medicine

## 2016-03-11 DIAGNOSIS — S63502A Unspecified sprain of left wrist, initial encounter: Secondary | ICD-10-CM

## 2016-03-11 MED ORDER — IBUPROFEN 800 MG PO TABS: 800.0000 mg | ORAL_TABLET | Freq: Three times a day (TID) | ORAL | 0 refills | Status: DC

## 2016-03-11 NOTE — Discharge Instructions (Signed)
Ice, splint and medicine as needed
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
## 2016-04-09 ENCOUNTER — Emergency Department (HOSPITAL_COMMUNITY)
Admission: EM | Admit: 2016-04-09 | Discharge: 2016-04-09 | Disposition: A | Discharge status: Home / Self Care | Payer: BLUE CROSS/BLUE SHIELD | Attending: Emergency Medicine | Admitting: Emergency Medicine

## 2016-04-09 ENCOUNTER — Encounter (HOSPITAL_COMMUNITY): Payer: Self-pay

## 2016-04-09 DIAGNOSIS — Z79899 Other long term (current) drug therapy: Secondary | ICD-10-CM | POA: Insufficient documentation

## 2016-04-09 DIAGNOSIS — F1910 Other psychoactive substance abuse, uncomplicated: Secondary | ICD-10-CM | POA: Insufficient documentation

## 2016-04-09 DIAGNOSIS — F17210 Nicotine dependence, cigarettes, uncomplicated: Secondary | ICD-10-CM | POA: Insufficient documentation

## 2016-04-09 LAB — CBC
HEMATOCRIT: 47.8 % (ref 39.0–52.0)
Hemoglobin: 16.6 g/dL (ref 13.0–17.0)
MCH: 30.8 pg (ref 26.0–34.0)
MCHC: 34.7 g/dL (ref 30.0–36.0)
MCV: 88.7 fL (ref 78.0–100.0)
Platelets: 257 10*3/uL (ref 150–400)
RBC: 5.39 MIL/uL (ref 4.22–5.81)
RDW: 13.8 % (ref 11.5–15.5)
WBC: 11.9 10*3/uL — AB (ref 4.0–10.5)

## 2016-04-09 LAB — COMPREHENSIVE METABOLIC PANEL
ALBUMIN: 4.5 g/dL (ref 3.5–5.0)
ALT: 87 U/L — ABNORMAL HIGH (ref 17–63)
AST: 59 U/L — AB (ref 15–41)
Alkaline Phosphatase: 58 U/L (ref 38–126)
Anion gap: 11 (ref 5–15)
BUN: 14 mg/dL (ref 6–20)
CHLORIDE: 103 mmol/L (ref 101–111)
CO2: 23 mmol/L (ref 22–32)
Calcium: 9.9 mg/dL (ref 8.9–10.3)
Creatinine, Ser: 1.21 mg/dL (ref 0.61–1.24)
GFR calc Af Amer: >60 mL/min (ref >60)
GFR calc non Af Amer: >60 mL/min (ref >60)
Glucose, Bld: 163 mg/dL — ABNORMAL HIGH (ref 65–99)
Potassium: 3.8 mmol/L (ref 3.5–5.1)
SODIUM: 137 mmol/L (ref 135–145)
Total Bilirubin: 0.8 mg/dL (ref 0.3–1.2)
Total Protein: 7.9 g/dL (ref 6.5–8.1)

## 2016-04-09 LAB — RAPID URINE DRUG SCREEN, HOSP PERFORMED
Amphetamines: NOT DETECTED
BARBITURATES: NOT DETECTED
BENZODIAZEPINES: NOT DETECTED
Cocaine: NOT DETECTED
Opiates: POSITIVE — AB
Tetrahydrocannabinol: POSITIVE — AB

## 2016-04-09 LAB — ETHANOL: Alcohol, Ethyl (B): <5 mg/dL (ref <5)

## 2016-04-09 NOTE — ED Triage Notes (Signed)
Per Pt[facility] so he could be inpatient. Pt reports having a court order to be evaluated and go through rehab. Reports Hx of percocet use, marijuana, and DUI.
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
## 2016-04-09 NOTE — ED Notes (Signed)
Pt is in stable condition upon d/c and ambulates from ED. 

## 2016-06-11 ENCOUNTER — Emergency Department (HOSPITAL_COMMUNITY)
Admission: EM | Admit: 2016-06-11 | Discharge: 2016-06-12 | Disposition: A | Discharge status: Other Acute Inpatient Hospital | Payer: BLUE CROSS/BLUE SHIELD

## 2016-06-11 DIAGNOSIS — F332 Major depressive disorder, recurrent severe without psychotic features: Secondary | ICD-10-CM | POA: Diagnosis present

## 2016-06-11 DIAGNOSIS — R45851 Suicidal ideations: Secondary | ICD-10-CM

## 2016-06-11 DIAGNOSIS — F1920 Other psychoactive substance dependence, uncomplicated: Secondary | ICD-10-CM | POA: Diagnosis present

## 2016-06-11 DIAGNOSIS — F1220 Cannabis dependence, uncomplicated: Secondary | ICD-10-CM | POA: Insufficient documentation

## 2016-06-11 DIAGNOSIS — F17210 Nicotine dependence, cigarettes, uncomplicated: Secondary | ICD-10-CM | POA: Insufficient documentation

## 2016-06-11 DIAGNOSIS — Z79899 Other long term (current) drug therapy: Secondary | ICD-10-CM | POA: Insufficient documentation

## 2016-06-11 DIAGNOSIS — F1320 Sedative, hypnotic or anxiolytic dependence, uncomplicated: Secondary | ICD-10-CM | POA: Insufficient documentation

## 2016-06-11 DIAGNOSIS — F1010 Alcohol abuse, uncomplicated: Secondary | ICD-10-CM | POA: Insufficient documentation

## 2016-06-11 DIAGNOSIS — F1994 Other psychoactive substance use, unspecified with psychoactive substance-induced mood disorder: Secondary | ICD-10-CM | POA: Diagnosis present

## 2016-06-11 LAB — ETHANOL: Alcohol, Ethyl (B): 204 mg/dL — ABNORMAL HIGH (ref <5)

## 2016-06-11 LAB — CBC
HEMATOCRIT: 44.3 % (ref 39.0–52.0)
HEMOGLOBIN: 15.7 g/dL (ref 13.0–17.0)
MCH: 31.2 pg (ref 26.0–34.0)
MCHC: 35.4 g/dL (ref 30.0–36.0)
MCV: 88.1 fL (ref 78.0–100.0)
Platelets: 304 10*3/uL (ref 150–400)
RBC: 5.03 MIL/uL (ref 4.22–5.81)
RDW: 13.1 % (ref 11.5–15.5)
WBC: 11.9 10*3/uL — AB (ref 4.0–10.5)

## 2016-06-11 LAB — COMPREHENSIVE METABOLIC PANEL
ALBUMIN: 4.4 g/dL (ref 3.5–5.0)
ALK PHOS: 59 U/L (ref 38–126)
ALT: 73 U/L — ABNORMAL HIGH (ref 17–63)
ANION GAP: 11 (ref 5–15)
AST: 44 U/L — AB (ref 15–41)
BUN: 14 mg/dL (ref 6–20)
CALCIUM: 9.4 mg/dL (ref 8.9–10.3)
CO2: 23 mmol/L (ref 22–32)
Chloride: 107 mmol/L (ref 101–111)
Creatinine, Ser: 1.11 mg/dL (ref 0.61–1.24)
GFR calc Af Amer: >60 mL/min (ref >60)
GFR calc non Af Amer: >60 mL/min (ref >60)
GLUCOSE: 91 mg/dL (ref 65–99)
POTASSIUM: 3.7 mmol/L (ref 3.5–5.1)
SODIUM: 141 mmol/L (ref 135–145)
Total Bilirubin: 0.6 mg/dL (ref 0.3–1.2)
Total Protein: 7.8 g/dL (ref 6.5–8.1)

## 2016-06-11 LAB — SALICYLATE LEVEL: Salicylate Lvl: <7 mg/dL (ref 2.8–30.0)

## 2016-06-11 LAB — RAPID URINE DRUG SCREEN, HOSP PERFORMED
Amphetamines: NOT DETECTED
BARBITURATES: NOT DETECTED
Benzodiazepines: POSITIVE — AB
COCAINE: NOT DETECTED
Opiates: NOT DETECTED
TETRAHYDROCANNABINOL: POSITIVE — AB

## 2016-06-11 LAB — ACETAMINOPHEN LEVEL

## 2016-06-11 MED ORDER — ALUM & MAG HYDROXIDE-SIMETH 200-200-20 MG/5ML PO SUSP: 30.0000 mL | Freq: Four times a day (QID) | ORAL | Status: DC | PRN

## 2016-06-11 MED ORDER — IBUPROFEN 200 MG PO TABS
600.0000 mg | ORAL_TABLET | Freq: Three times a day (TID) | ORAL | Status: DC | PRN
  Filled 2016-06-11: qty 3

## 2016-06-11 MED ORDER — THIAMINE HCL 100 MG/ML IJ SOLN: 100.0000 mg | Freq: Every day | INTRAMUSCULAR | Status: DC

## 2016-06-11 MED ORDER — ACETAMINOPHEN 325 MG PO TABS: 650.0000 mg | ORAL_TABLET | ORAL | Status: DC | PRN

## 2016-06-11 MED ORDER — LORAZEPAM 1 MG PO TABS
0.0000 mg | ORAL_TABLET | Freq: Four times a day (QID) | ORAL | Status: DC
  Administered 2016-06-11: 2 mg via ORAL
  Filled 2016-06-11: qty 2

## 2016-06-11 MED ORDER — ONDANSETRON HCL 4 MG PO TABS: 4.0000 mg | ORAL_TABLET | Freq: Three times a day (TID) | ORAL | Status: DC | PRN

## 2016-06-11 MED ORDER — LORAZEPAM 1 MG PO TABS: 0.0000 mg | ORAL_TABLET | Freq: Two times a day (BID) | ORAL | Status: DC

## 2016-06-11 MED ORDER — NICOTINE 21 MG/24HR TD PT24
21.0000 mg | MEDICATED_PATCH | Freq: Every day | TRANSDERMAL | Status: DC
  Administered 2016-06-11: 21 mg via TRANSDERMAL
  Filled 2016-06-11 (×2): qty 1

## 2016-06-11 MED ORDER — VITAMIN B-1 100 MG PO TABS
100.0000 mg | ORAL_TABLET | Freq: Every day | ORAL | Status: DC
  Administered 2016-06-11: 100 mg via ORAL
  Filled 2016-06-11: qty 1

## 2016-06-11 MED ORDER — LORAZEPAM 2 MG/ML IJ SOLN: 0.0000 mg | Freq: Four times a day (QID) | INTRAMUSCULAR | Status: DC

## 2016-06-11 MED ORDER — LORAZEPAM 2 MG/ML IJ SOLN: 0.0000 mg | Freq: Two times a day (BID) | INTRAMUSCULAR | Status: DC
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
## 2016-06-11 NOTE — ED Notes (Signed)
Pt wife Gilman Buttner(Clara Smith): [phone]

## 2016-06-11 NOTE — ED Provider Notes (Signed)
[facility]ENCY DEPT Provider Note   CSN: [id number] Arrival date & time: 06/11/16  1735     History   Chief Complaint Chief Complaint  Patient presents with  . Psychiatric Evaluation    SI  . Alcohol Intoxication    HPI Charles Gardner is a 26 y.o. male.  26 year old male who presents with alcohol intoxication and suicidal ideation. The patient has been drinking heavily today and earlier gotten into an argument with his wife. Wife and family report that he then grabbed a knife and said he was going to cut his wrists. They were able to wrestle the knife away from him but then he stated he was going to get a gun. He stated that they should call the police so that the police would shoot him when they get there. On my exam, he reports that he may have some symptoms of depression but he denies any specific suicidal ideation. LEVEL 5 CAVEAT DUE TO ALCOHOL INTOXICATION AND AMS    Alcohol Intoxication     Past Medical History:  Diagnosis Date  . Poor dental hygiene     Patient Active Problem List   Diagnosis Date Noted  . Poor dental hygiene     Past Surgical History:  Procedure Laterality Date  . SHOULDER SURGERY     Right shoulder  . SHOULDER SURGERY         Home Medications    Prior to Admission medications   Medication Sig Start Date End Date Taking? Authorizing Provider  acetaminophen (TYLENOL) 500 MG tablet Take 500 mg by mouth every 6 (six) hours as needed for moderate pain.   Yes [provider]  ALPRAZolam (XANAX PO) Take 4 tablets by mouth once.   Yes [provider]  diphenhydrAMINE (BENADRYL) 25 MG tablet Take 25 mg by mouth every 6 (six) hours as needed for sleep.   Yes [provider]  ibuprofen (ADVIL,MOTRIN) 200 MG tablet Take 400 mg by mouth every 6 (six) hours as needed for moderate pain.   Yes [provider]  ibuprofen (ADVIL,MOTRIN) 800 MG tablet Take 1 tablet (800 mg total) by mouth 3 (three) times daily.  03/11/16   Linna Hoff, MD    Family History No family history on file.  Social History Social History  Substance Use Topics  . Smoking status: Current Every Day Smoker    Packs/day: 0.50    Types: Cigarettes  . Smokeless tobacco: Former Neurosurgeon    Types: Chew  . Alcohol use Yes     Comment: occasionally      Allergies   Patient has no known allergies.   Review of Systems Review of Systems  Unable to perform ROS: Mental status change     Physical Exam Updated Vital Signs BP 122/78 (BP Location: Right Arm)   Pulse (!) 103   Temp 98.3 F (36.8 C) (Oral)   Resp 14   SpO2 94%   Physical Exam  Constitutional: He is oriented to person, place, and time. He appears well-developed and well-nourished. No distress.  HENT:  Head: Normocephalic and atraumatic.  Dry mucous membranes  Eyes: Conjunctivae are normal. Pupils are equal, round, and reactive to light.  Neck: Neck supple.  Cardiovascular: Normal rate, regular rhythm and normal heart sounds.   No murmur heard. Pulmonary/Chest: Effort normal and breath sounds normal.  Abdominal: Soft. Bowel sounds are normal. He exhibits no distension. There is no tenderness.  Musculoskeletal: He exhibits no edema.  Neurological: He is alert and  oriented to person, place, and time.  slurred speech, moving all 4 extremities equally  Skin: Skin is warm and dry.  Psychiatric:  Intoxicated, depressed mood  Nursing note and vitals reviewed.    ED Treatments / Results  Labs (all labs ordered are listed, but only abnormal results are displayed) Labs Reviewed  COMPREHENSIVE METABOLIC PANEL - Abnormal; Notable for the following:       Result Value   AST 44 (*)    ALT 73 (*)    All other components within normal limits  ETHANOL - Abnormal; Notable for the following:    Alcohol, Ethyl (B) 204 (*)    All other components within normal limits  ACETAMINOPHEN LEVEL - Abnormal; Notable for the following:    Acetaminophen (Tylenol),  Serum <10 (*)    All other components within normal limits  CBC - Abnormal; Notable for the following:    WBC 11.9 (*)    All other components within normal limits  RAPID URINE DRUG SCREEN, HOSP PERFORMED - Abnormal; Notable for the following:    Benzodiazepines POSITIVE (*)    Tetrahydrocannabinol POSITIVE (*)    All other components within normal limits  SALICYLATE LEVEL    EKG  EKG Interpretation None       Radiology No results found.  Procedures Procedures (including critical care time)  Medications Ordered in ED Medications  LORazepam (ATIVAN) injection 0-4 mg (not administered)    Or  LORazepam (ATIVAN) tablet 0-4 mg (not administered)  LORazepam (ATIVAN) injection 0-4 mg (not administered)    Or  LORazepam (ATIVAN) tablet 0-4 mg (not administered)  thiamine (VITAMIN B-1) tablet 100 mg (not administered)    Or  thiamine (B-1) injection 100 mg (not administered)  acetaminophen (TYLENOL) tablet 650 mg (not administered)  alum & mag hydroxide-simeth (MAALOX/MYLANTA) 200-200-20 MG/5ML suspension 30 mL (not administered)  ibuprofen (ADVIL,MOTRIN) tablet 600 mg (not administered)  nicotine (NICODERM CQ - dosed in mg/24 hours) patch 21 mg (not administered)  ondansetron (ZOFRAN) tablet 4 mg (not administered)     Initial Impression / Assessment and Plan / ED Course  I have reviewed the triage vital signs and the nursing notes.  Pertinent labs & imaging results that were available during my care of the patient were reviewed by me and considered in my medical decision making (see chart for details).     Pt Not in by police after he threatened to kill himself with a knife and later with a gun while intoxicated. He was intoxicated on exam but with normal vital signs and a reassuring physical exam. Obtained screening lab work. Contacted TTS for evaluation.  Labwork shows UDS positive for benzos and THC, blood alcohol 204. The remainder of his lab work is stable, mild  elevation of LFTs likely related to alcohol use. He is medically clear for psychiatry evaluation. His disposition will be determined by psychiatry team recommendations.  Final Clinical Impressions(s) / ED Diagnoses   Final diagnoses:  None    New Prescriptions New Prescriptions   No medications on file     Helen Cuff, Ambrose Finlandachel Morgan, MD 06/11/16 [phone]

## 2016-06-11 NOTE — ED Notes (Addendum)
Pt also states he took Xanax x4 with ETOH.

## 2016-06-11 NOTE — ED Notes (Signed)
Charles Gardner approached the nurses station and stated " I need you to given me my clothes so I can leave" He was notifed that if he attempted to leave he would be stopped by security and IVC papers would be served. He verbalized understanding but five minutes later was stopped by this Clinical research associatewriter and security attempting to leave the department. When asked where he was going he replied "I was going to the bathroom", but in private conversation with pt's wife she stated that he told her that he was going to attempt to leave the hospital. Dr Clarene DukeLittle notified and order to move pt to [facility] was obtained.
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
## 2016-06-12 ENCOUNTER — Encounter (HOSPITAL_COMMUNITY): Payer: Self-pay

## 2016-06-12 ENCOUNTER — Inpatient Hospital Stay (HOSPITAL_COMMUNITY)
Admission: AD | Admit: 2016-06-12 | Discharge: 2016-06-17 | DRG: 885 | Disposition: A | Discharge status: Home / Self Care | Payer: No Typology Code available for payment source | Attending: Psychiatry | Admitting: Psychiatry

## 2016-06-12 DIAGNOSIS — F332 Major depressive disorder, recurrent severe without psychotic features: Principal | ICD-10-CM | POA: Diagnosis present

## 2016-06-12 DIAGNOSIS — Z79899 Other long term (current) drug therapy: Secondary | ICD-10-CM | POA: Diagnosis not present

## 2016-06-12 DIAGNOSIS — R45851 Suicidal ideations: Secondary | ICD-10-CM | POA: Diagnosis present

## 2016-06-12 DIAGNOSIS — Z818 Family history of other mental and behavioral disorders: Secondary | ICD-10-CM

## 2016-06-12 DIAGNOSIS — F411 Generalized anxiety disorder: Secondary | ICD-10-CM | POA: Diagnosis present

## 2016-06-12 DIAGNOSIS — F1920 Other psychoactive substance dependence, uncomplicated: Secondary | ICD-10-CM | POA: Diagnosis present

## 2016-06-12 DIAGNOSIS — K047 Periapical abscess without sinus: Secondary | ICD-10-CM

## 2016-06-12 DIAGNOSIS — F17210 Nicotine dependence, cigarettes, uncomplicated: Secondary | ICD-10-CM

## 2016-06-12 DIAGNOSIS — F1994 Other psychoactive substance use, unspecified with psychoactive substance-induced mood disorder: Secondary | ICD-10-CM | POA: Diagnosis present

## 2016-06-12 DIAGNOSIS — Z9189 Other specified personal risk factors, not elsewhere classified: Secondary | ICD-10-CM

## 2016-06-12 DIAGNOSIS — F419 Anxiety disorder, unspecified: Secondary | ICD-10-CM

## 2016-06-12 DIAGNOSIS — G4700 Insomnia, unspecified: Secondary | ICD-10-CM

## 2016-06-12 MED ORDER — ADULT MULTIVITAMIN W/MINERALS CH
1.0000 | ORAL_TABLET | Freq: Every day | ORAL | Status: DC
  Administered 2016-06-13 – 2016-06-17 (×5): 1 via ORAL
  Filled 2016-06-12 (×7): qty 1

## 2016-06-12 MED ORDER — IBUPROFEN 600 MG PO TABS
600.0000 mg | ORAL_TABLET | Freq: Three times a day (TID) | ORAL | Status: DC | PRN
  Administered 2016-06-13 – 2016-06-14 (×3): 600 mg via ORAL
  Filled 2016-06-12 (×3): qty 1

## 2016-06-12 MED ORDER — VITAMIN B-1 100 MG PO TABS: 100.0000 mg | ORAL_TABLET | Freq: Every day | ORAL | Status: DC

## 2016-06-12 MED ORDER — TRAZODONE HCL 50 MG PO TABS
50.0000 mg | ORAL_TABLET | Freq: Every evening | ORAL | Status: DC | PRN
  Administered 2016-06-12 – 2016-06-15 (×6): 50 mg via ORAL
  Filled 2016-06-12 (×12): qty 1

## 2016-06-12 MED ORDER — CHLORDIAZEPOXIDE HCL 25 MG PO CAPS
25.0000 mg | ORAL_CAPSULE | ORAL | Status: AC
  Administered 2016-06-14 – 2016-06-15 (×2): 25 mg via ORAL
  Filled 2016-06-12 (×2): qty 1

## 2016-06-12 MED ORDER — HYDROXYZINE HCL 25 MG PO TABS
25.0000 mg | ORAL_TABLET | Freq: Four times a day (QID) | ORAL | Status: AC | PRN
  Administered 2016-06-13 – 2016-06-14 (×3): 25 mg via ORAL
  Filled 2016-06-12 (×3): qty 1

## 2016-06-12 MED ORDER — ALUM & MAG HYDROXIDE-SIMETH 200-200-20 MG/5ML PO SUSP: 30.0000 mL | Freq: Four times a day (QID) | ORAL | Status: DC | PRN

## 2016-06-12 MED ORDER — LOPERAMIDE HCL 2 MG PO CAPS: 2.0000 mg | ORAL_CAPSULE | ORAL | Status: DC | PRN

## 2016-06-12 MED ORDER — CHLORDIAZEPOXIDE HCL 25 MG PO CAPS: 25.0000 mg | ORAL_CAPSULE | Freq: Three times a day (TID) | ORAL | Status: DC

## 2016-06-12 MED ORDER — NICOTINE 21 MG/24HR TD PT24
21.0000 mg | MEDICATED_PATCH | Freq: Every day | TRANSDERMAL | Status: DC
  Administered 2016-06-13 – 2016-06-17 (×5): 21 mg via TRANSDERMAL
  Filled 2016-06-12 (×3): qty 1
  Filled 2016-06-12: qty 21
  Filled 2016-06-12 (×3): qty 1

## 2016-06-12 MED ORDER — CHLORDIAZEPOXIDE HCL 25 MG PO CAPS: 25.0000 mg | ORAL_CAPSULE | ORAL | Status: DC

## 2016-06-12 MED ORDER — GABAPENTIN 100 MG PO CAPS
200.0000 mg | ORAL_CAPSULE | Freq: Two times a day (BID) | ORAL | Status: DC
  Administered 2016-06-12 – 2016-06-17 (×10): 200 mg via ORAL
  Filled 2016-06-12 (×4): qty 2
  Filled 2016-06-12: qty 88
  Filled 2016-06-12 (×4): qty 2
  Filled 2016-06-12: qty 88
  Filled 2016-06-12 (×5): qty 2

## 2016-06-12 MED ORDER — CHLORDIAZEPOXIDE HCL 25 MG PO CAPS
25.0000 mg | ORAL_CAPSULE | Freq: Three times a day (TID) | ORAL | Status: AC
  Administered 2016-06-13 – 2016-06-14 (×3): 25 mg via ORAL
  Filled 2016-06-12 (×3): qty 1

## 2016-06-12 MED ORDER — LOPERAMIDE HCL 2 MG PO CAPS: 2.0000 mg | ORAL_CAPSULE | ORAL | Status: AC | PRN

## 2016-06-12 MED ORDER — CHLORDIAZEPOXIDE HCL 25 MG PO CAPS
25.0000 mg | ORAL_CAPSULE | Freq: Four times a day (QID) | ORAL | Status: DC
  Administered 2016-06-12: 25 mg via ORAL
  Filled 2016-06-12: qty 1

## 2016-06-12 MED ORDER — HYDROXYZINE HCL 25 MG PO TABS: 25.0000 mg | ORAL_TABLET | Freq: Four times a day (QID) | ORAL | Status: DC | PRN

## 2016-06-12 MED ORDER — GABAPENTIN 100 MG PO CAPS
200.0000 mg | ORAL_CAPSULE | Freq: Two times a day (BID) | ORAL | Status: DC
  Administered 2016-06-12: 200 mg via ORAL
  Filled 2016-06-12: qty 2

## 2016-06-12 MED ORDER — CHLORDIAZEPOXIDE HCL 25 MG PO CAPS
25.0000 mg | ORAL_CAPSULE | Freq: Every day | ORAL | Status: DC
Start: 2016-06-15 — End: 2016-06-12

## 2016-06-12 MED ORDER — CHLORDIAZEPOXIDE HCL 25 MG PO CAPS
25.0000 mg | ORAL_CAPSULE | Freq: Four times a day (QID) | ORAL | Status: AC
  Administered 2016-06-12 – 2016-06-13 (×3): 25 mg via ORAL
  Filled 2016-06-12 (×3): qty 1

## 2016-06-12 MED ORDER — ACETAMINOPHEN 325 MG PO TABS: 650.0000 mg | ORAL_TABLET | ORAL | Status: DC | PRN

## 2016-06-12 MED ORDER — THIAMINE HCL 100 MG/ML IJ SOLN: 100.0000 mg | Freq: Once | INTRAMUSCULAR | Status: DC

## 2016-06-12 MED ORDER — MAGNESIUM HYDROXIDE 400 MG/5ML PO SUSP: 30.0000 mL | Freq: Every day | ORAL | Status: DC | PRN

## 2016-06-12 MED ORDER — ONDANSETRON HCL 4 MG PO TABS: 4.0000 mg | ORAL_TABLET | Freq: Three times a day (TID) | ORAL | Status: DC | PRN

## 2016-06-12 MED ORDER — CHLORDIAZEPOXIDE HCL 25 MG PO CAPS: 25.0000 mg | ORAL_CAPSULE | Freq: Four times a day (QID) | ORAL | Status: DC | PRN

## 2016-06-12 MED ORDER — CHLORDIAZEPOXIDE HCL 25 MG PO CAPS
25.0000 mg | ORAL_CAPSULE | Freq: Every day | ORAL | Status: AC
  Administered 2016-06-16: 25 mg via ORAL
  Filled 2016-06-12: qty 1

## 2016-06-12 MED ORDER — VITAMIN B-1 100 MG PO TABS
100.0000 mg | ORAL_TABLET | Freq: Every day | ORAL | Status: DC
  Administered 2016-06-13 – 2016-06-17 (×5): 100 mg via ORAL
  Filled 2016-06-12 (×7): qty 1

## 2016-06-12 MED ORDER — ADULT MULTIVITAMIN W/MINERALS CH
1.0000 | ORAL_TABLET | Freq: Every day | ORAL | Status: DC
  Administered 2016-06-12: 1 via ORAL
  Filled 2016-06-12: qty 1

## 2016-06-12 MED ORDER — ONDANSETRON 4 MG PO TBDP: 4.0000 mg | ORAL_TABLET | Freq: Four times a day (QID) | ORAL | Status: DC | PRN

## 2016-06-12 MED ORDER — CHLORDIAZEPOXIDE HCL 25 MG PO CAPS: 25.0000 mg | ORAL_CAPSULE | Freq: Four times a day (QID) | ORAL | Status: AC | PRN

## 2016-06-12 NOTE — Progress Notes (Signed)
06/12/16 1332:  LRT introduced self to pt and offered activities, pt declined.  Caroll RancherMarjette Siani Utke, LRT/CTRS

## 2016-06-12 NOTE — Tx Team (Signed)
Initial Treatment Plan 06/12/2016 7:42 PM Charles Gardner [id number]    PATIENT STRESSORS: Financial difficulties Legal issue Marital or family conflict Occupational concerns Substance abuse   PATIENT STRENGTHS: Wellsite geologistCommunication skills General fund of knowledge Physical Health   PATIENT IDENTIFIED PROBLEMS: Depression  Anxiety  Substance abuse  Suicidal ideation  "Get off alcohol"  "Get off pain pills"           DISCHARGE CRITERIA:  Improved stabilization in mood, thinking, and/or behavior Verbal commitment to aftercare and medication compliance Withdrawal symptoms are absent or subacute and managed without 24-hour nursing intervention  PRELIMINARY DISCHARGE PLAN: Outpatient therapy Medication management  PATIENT/FAMILY INVOLVEMENT: This treatment plan has been presented to and reviewed with the patient, Charles Gardner.  The patient and family have been given the opportunity to ask questions and make suggestions.  Levin BaconHeather V Adarrius Graeff, RN 06/12/2016, 7:42 PM
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
## 2016-06-12 NOTE — Progress Notes (Signed)
Pt attended the evening AA speaker meeting. Pt was engaged and appropriate. [name] C, NT 06/12/16 9:59 PM  
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
## 2016-06-12 NOTE — ED Notes (Signed)
[facility] [employer] [employer].

## 2016-06-12 NOTE — Progress Notes (Signed)
Patient ID: Charles Gardner, male   DOB: 05/22/1990, 26 y.o.   MRN: [id number] D: Patient observed watching TV and interacting well with peers on approach. Pt reports financial stressor as reason for abusing drugs. Pt reports stealing wife's percocet for toothache. Pt reports feeling depressed but has no money for medication or follow up with a doctor. Denies  SI/HI/AVH.No behavioral issues noted.  A: Support and encouragement offered as needed. Medications administered as prescribed.  R: Patient cooperative and appropriate on unit. Will continue to monitor patient for safety and stability.

## 2016-06-12 NOTE — BH Assessment (Signed)
[facility] Assessment Progress Note  Per Thedore MinsMojeed Akintayo, MD, this pt requires psychiatric hospitalization.  Malva LimesLinsey Strader, RN, [id number]; she will call when the bed is available.  Pt presents under IVC, and [facility]  Pt's nurse, Carlisle BeersLuann, has been notified, and agrees to call report to [phone].  Pt is to be transported via [employer].   Doylene Canninghomas Schneider [place], MA Triage Specialist [phone]

## 2016-06-12 NOTE — Plan of Care (Signed)
Problem: Safety: Goal: Ability to remain free from injury will improve Outcome: Progressing Pt is safe and denies SI/HI   

## 2016-06-13 DIAGNOSIS — F1010 Alcohol abuse, uncomplicated: Secondary | ICD-10-CM

## 2016-06-13 DIAGNOSIS — Z9189 Other specified personal risk factors, not elsewhere classified: Secondary | ICD-10-CM

## 2016-06-13 DIAGNOSIS — K047 Periapical abscess without sinus: Secondary | ICD-10-CM | POA: Diagnosis present

## 2016-06-13 DIAGNOSIS — F332 Major depressive disorder, recurrent severe without psychotic features: Principal | ICD-10-CM

## 2016-06-13 DIAGNOSIS — F1920 Other psychoactive substance dependence, uncomplicated: Secondary | ICD-10-CM

## 2016-06-13 DIAGNOSIS — Z818 Family history of other mental and behavioral disorders: Secondary | ICD-10-CM

## 2016-06-13 DIAGNOSIS — G4700 Insomnia, unspecified: Secondary | ICD-10-CM

## 2016-06-13 MED ORDER — ESCITALOPRAM OXALATE 5 MG PO TABS
5.0000 mg | ORAL_TABLET | Freq: Every day | ORAL | Status: DC
  Administered 2016-06-13 – 2016-06-17 (×5): 5 mg via ORAL
  Filled 2016-06-13 (×6): qty 1
  Filled 2016-06-13: qty 22
  Filled 2016-06-13: qty 1

## 2016-06-13 MED ORDER — ACAMPROSATE CALCIUM 333 MG PO TBEC
666.0000 mg | DELAYED_RELEASE_TABLET | Freq: Three times a day (TID) | ORAL | Status: DC
  Administered 2016-06-13 – 2016-06-17 (×12): 666 mg via ORAL
  Filled 2016-06-13: qty 132
  Filled 2016-06-13 (×8): qty 2
  Filled 2016-06-13: qty 132
  Filled 2016-06-13: qty 2
  Filled 2016-06-13: qty 132
  Filled 2016-06-13 (×7): qty 2

## 2016-06-13 MED ORDER — PENICILLIN V POTASSIUM 500 MG PO TABS
500.0000 mg | ORAL_TABLET | Freq: Three times a day (TID) | ORAL | Status: DC
  Administered 2016-06-13 – 2016-06-17 (×11): 500 mg via ORAL
  Filled 2016-06-13 (×6): qty 1
  Filled 2016-06-13: qty 12
  Filled 2016-06-13 (×6): qty 1
  Filled 2016-06-13: qty 12
  Filled 2016-06-13: qty 2
  Filled 2016-06-13: qty 1
  Filled 2016-06-13: qty 2
  Filled 2016-06-13: qty 12
  Filled 2016-06-13: qty 1

## 2016-06-13 NOTE — [facility] Group Notes (Signed)
[facility] LCSW Group Therapy Note  06/13/2016  and  10:00 AM  Type of Therapy and Topic:  Group Therapy: Avoiding Self-Sabotaging and Enabling Behaviors  Participation Level:  Active  Participation Quality:  Attentive and Sharing  Affect:  Appropriate  Cognitive:  Alert and Oriented  Insight:  Developing/Improving  Engagement in Therapy:  Developing/Improving   Therapeutic models used: Cognitive Behavioral Therapy,  Person-Centered Therapy and Motivational Interviewing  Modes of Intervention:  Discussion, Exploration, Orientation, Rapport Building, Socialization and Support   Summary of Progress/Problems:  The main focus of today's process group was for the patient to identify ways in which they have in the past sabotaged their own recovery. Motivational Interviewing was utilized to identify motivation they may have for wanting to change. The Stages of Change were explained using a handout, and patients identified where they currently are with regard to stages of change.    Charles Bernatherine C Harrill, LCSW

## 2016-06-13 NOTE — Progress Notes (Signed)
Did not attend RN group. Healthy Coping Skills.    Did not attend RN group. Healthy Coping Skills.

## 2016-06-13 NOTE — [facility] Suicide Risk Assessment (Signed)
[facility] Admission Suicide Risk Assessment   Nursing information obtained from:  Patient Demographic factors:  Male, Caucasian Current Mental Status:  NA Loss Factors:  Decrease in vocational status, Financial problems / change in socioeconomic status, Legal issues Historical Factors:  NA Risk Reduction Factors:  Living with another person, especially a relative  Total Time spent with patient: 45 minutes Principal Problem: Substance Induced Mood Disorder Diagnosis:   Patient Active Problem List   Diagnosis Date Noted  . Polysubstance (excluding opioids) dependence ([facility]) [F19.20] 06/12/2016  . Major depressive disorder, recurrent severe without psychotic features ([facility]) [F33.2] 06/12/2016  . Poor dental hygiene [Z91.89]    Subjective Data:  26 yo Caucasian male, married, lives with his family. Background history of SUD. Drinks and uses xanax on a daily basis. Presented to the ER via the police. Committed involuntarily on account of suicidal thoughts. Expressed plans to slit self with a pocket knife. Expressed plans to obtain a shotgun and end his own life. BAL was 206 mg/dl. UDS was positive for THC and benzodiazepines. AST and ALT are mildly elevated.  At interview, cannot remember expressing suicidal thoughts. Says he could barely remember the car ride to the hospital. Notes he was intoxicated. Says he has no intentions to harm himself or anyone else. Says he has never attempted suicide in the past. Showed me his forearms to prove he has never cut self. Scratch marks are from their cat. No hallucination in any modality. Persecutory delusion. No other form of delusion. No evidence of withdrawals at this time. Has been on detox protocol. No thoughts of violence. No evidence of depression. No evidence of mania. No anxiety.  Reports use of pain pills too. Just got off probation from DUI. Used to work as a Company secretary. Has been unemployed for months. Reports he has firearms at home. Family history of  addiction. No family history of suicide. Reports he has a lot of friends that have committed suicide. Says he knows the effect it had on their families. States he has four step children and he just got married four months ago. Wants to be there for his family We explored treatment. He wants to do outpatient rehab. We discussed use of Acamprosate to address cravings. Patient consented to treatment after we explored the risks and benefits.    Continued Clinical Symptoms:  Alcohol Use Disorder Identification Test Final Score (AUDIT): 11 The "Alcohol Use Disorders Identification Test", Guidelines for Use in Primary Care, Second Edition.  World Science writer [facility]). Score between 0-7:  no or low risk or alcohol related problems. Score between 8-15:  moderate risk of alcohol related problems. Score between 16-19:  high risk of alcohol related problems. Score 20 or above:  warrants further diagnostic evaluation for alcohol dependence and treatment.   CLINICAL FACTORS:  Substance use disorder   Musculoskeletal: Strength & Muscle Tone: within normal limits Gait & Station: normal Patient leans: N/A  Psychiatric Specialty Exam: Physical Exam  Constitutional: He is oriented to person, place, and time. He appears well-developed and well-nourished.  HENT:  Head: Normocephalic and atraumatic.  Eyes: Conjunctivae are normal. Pupils are equal, round, and reactive to light.  Neck: Normal range of motion. Neck supple.  Cardiovascular: Normal rate and regular rhythm.   Respiratory: Effort normal and breath sounds normal.  GI: Soft. Bowel sounds are normal.  Musculoskeletal: Normal range of motion.  Neurological: He is alert and oriented to person, place, and time.  Skin: Skin is warm and dry.  Psychiatric:  As above    ROS  Blood pressure (!) 143/83, pulse 80, temperature 97.7 F (36.5 C), temperature source Oral, resp. rate 16, height 5\' 7"  (1.702 m), weight 78 kg (172 lb), SpO2 100 %.Body  mass index is 26.94 kg/m.  General Appearance: Well built, pleasant and relates well. No evidence of withdrawals. Not internally distracted.   Eye Contact:  Good  Speech:  Clear and Coherent and Normal Rate  Volume:  Normal  Mood:  Euthymic  Affect:  Appropriate and Full Range  Thought Process:  Linear  Orientation:  Full (Time, Place, and Person)  Thought Content:  No delusional theme. No preoccupation with violent thoughts. No negative ruminations. No obsession.  No hallucination in any modality.   Suicidal Thoughts:  No  Homicidal Thoughts:  No  Memory:  Immediate;   Good Recent;   Fair Remote;   Fair  Judgement:  Fair  Insight:  Fair  Psychomotor Activity:  Normal  Concentration:  Concentration: Fair and Attention Span: Fair  Recall:  [employer]Fair  Fund of Knowledge:  Good  Language:  Good  Akathisia:  NA  Handed:    AIMS (if indicated):     Assets:  Communication Skills Housing Intimacy Social Support  ADL's:  Intact  Cognition:  WNL  Sleep:  Number of Hours: 6      COGNITIVE FEATURES THAT CONTRIBUTE TO RISK:  None    SUICIDE RISK:   Moderate:  Frequent suicidal ideation with limited intensity, and duration, some specificity in terms of plans, no associated intent, good self-control, limited dysphoria/symptomatology, some risk factors present, and identifiable protective factors, including available and accessible social support.  PLAN OF CARE:   Psychiatric: SUD (alcohol, benzodiazepine and THC) Substance induced mood disorder  Medical: Pancreatitis Seizure disorder  Psychosocial:   PLAN: 1. Alcohol withdrawal protocol 2. Acamprosate 666 mg TID 3. Encourage unit groups and activities 4. Monitor mood, behavior and interaction with peers 5. SW would facilitate aftercare 6. Allow voluntary admission.   I certify that inpatient services furnished can reasonably be expected to improve the patient's condition.   Georgiann CockerVincent A Izediuno, MD 06/13/2016, 1:24 PM

## 2016-06-13 NOTE — Progress Notes (Signed)
  D: Pt presents anxious on approach. Pt appears to be blaming others for his drug abuse. Pt denies any withdrawal symptoms this morning and noon-time. Pt stated that he's not suicidal today and verbally contracts for safety. Pt rates depression 3/10. Anxiety 1/10. Pt doesn't appear to be invested in tx. Pt repeatedly asked if he could be discharged home today to his wife and kids. Pt continues to c/o a toothache. Motrin given for discomfort. A: Medications reviewed with pt. Medications administered as ordered per MD. Verbal support provided. Pt encouraged to attend groups. 15 minute checks performed for safety. R: Pt compliant with tx.

## 2016-06-13 NOTE — Progress Notes (Signed)
Patient did attend the evening speaker AA meeting.  
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
## 2016-06-14 NOTE — Progress Notes (Signed)
D    Pt is appropriate and pleasant on approach   He endorses depression and anxiety   Reports his teeth are bothering him and requested medication for same  He is compliant with treatment A    Verbal support given   Medications administered and effectiveness monitored    Educate on medications   Q 15 min checks R  Pt is safe at present

## 2016-06-14 NOTE — Progress Notes (Signed)
D: Spoke with patient 1:1 who is up and visible in dayroom. Interacting appropriately. Patient's affect anxious with congruent mood. Rating depression at a 1/10, hopelessness and anxiety both at a 0/10. Rates sleep as good, appetite as good, energy as normal and concentration as good.  States goal for today is "hope to be able to leave and start [facility]." Denies pain, physical problems. No withdrawal noted. Patient minimizes events prior to admit. Insight poor.   A: Medicated per orders, educations provided. No prns requested or required. Emotional support offered and self inventory reviewed. Encouraged completion of Suicide Safety Plan. Discussed POC with MD, SW.  R: Patient verbalizes understanding of POC. Patient denies SI/HI/AVH and remains safe on level III obs.

## 2016-06-14 NOTE — [facility] Suicide Risk Assessment (Signed)
[facility] INPATIENT:  Family/Significant Other Suicide Prevention Education  Suicide Prevention Education:  Contact Attempts: [name], wife, [name] [phone] has been identified by the patient as the family member/significant other with whom the patient will be residing, and identified as the person(s) who will aid the patient in the event of a mental health crisis.  With written consent from the patient, two attempts were made to provide suicide prevention education, prior to and/or following the patient's discharge.  We were unsuccessful in providing suicide prevention education.  A suicide education pamphlet was given to the patient to share with family/significant other.  Date and time of first attempt:06/14/16/4:05pm Date and time of second attempt:   To be done  Lynnell ChadMareida J Grossman-Orr 06/14/2016, 4:07 PM

## 2016-06-14 NOTE — [facility] Group Notes (Signed)
[facility] LCSW Group Therapy  06/14/2016  10 AM  Type of Therapy:  Group Therapy  Participation Level:  Active  Participation Quality:  Attentive and Sharing  Affect:  Appropriate  Cognitive:  Alert and Oriented  Insight:  Developing/Improving  Engagement in Therapy:  Developing/Improving  Modes of Intervention:  Clarification, Exploration, Rapport Building, Socialization and Support  Summary of Progress/Problems: Topic for today was thoughts and feelings regarding discharge. We discussed fears of upcoming changes including judgements, expectations and stigma of mental health issues. We then discussed supports: what constitutes a supportive framework, identification of supports and what to do when others are not supportive. Patients then identified a specific coping tool to use when others are not available. Patient processed how he needs to avoid certain people as they are triggers for his use and abuse of substances they provide.    Carney Bernatherine C Harrill, LCSW
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
## 2016-06-14 NOTE — Plan of Care (Signed)
Problem: Education: Goal: Verbalization of understanding the information provided will improve Outcome: Progressing Patient verbalizes understanding of information, education provided.  Problem: Safety: Goal: Periods of time without injury will increase Outcome: Progressing Patient has not engaged in self harm.   

## 2016-06-14 NOTE — [facility] Group Notes (Signed)
The focus of this group is to educate the patient on the purpose and policies of crisis stabilization and provide a format to answer questions about their admission.  The group details unit policies and expectations of patients while admitted.  Patient attended 0900 nurse education orientation group this morning.  Patient actively participated and had appropriate affect.  Patient is alert.  Patient had appropriate insight and appropriate engagement.  Today patient will work on 3 goals for discharge.  

## 2016-06-14 NOTE — Progress Notes (Signed)
Patient did attend the evening speaker AA meeting.  

## 2016-06-15 MED ORDER — LISINOPRIL 10 MG PO TABS
10.0000 mg | ORAL_TABLET | Freq: Every day | ORAL | Status: DC
  Administered 2016-06-15 – 2016-06-17 (×3): 10 mg via ORAL
  Filled 2016-06-15 (×3): qty 1
  Filled 2016-06-15: qty 22
  Filled 2016-06-15: qty 1

## 2016-06-15 NOTE — Progress Notes (Signed)
[facility] MD Progress Note  06/15/2016 3:25 PM Charles Gardner  MRN:  [id number] Subjective:   26 yo Caucasian male, married, lives with his family. Background history of SUD. Drinks and uses xanax on a daily basis. Presented to the ER via the police. Committed involuntarily on account of suicidal thoughts. Expressed plans to slit self with a pocket knife. Expressed plans to obtain a shotgun and end his own life. BAL was 206 mg/dl. UDS was positive for THC and benzodiazepines. AST and ALT are mildly elevated.   Chart reviewed today. Patient discussed at team  Nursing staff reports that patient has been appropriate on the unit. Patient has been interacting well with peers. No behavioral issues. Patient has not voiced any suicidal thoughts. Patient has not been observed to be internally stimulated. Patient has been adherent with treatment recommendations. Patient has been tolerating their medication well.   Seen today. Reports that he is in good spirits. Not feeling depressed. Reports normal energy and interest. Has been maintaining normal biological functions. He is able to think clearly. He is able to focus on task. His thoughts are not crowded or racing. No evidence of mania. No hallucination in any modality. He is not making any delusional statement. No passivity of will/thought. He is fully in touch with reality. No thoughts of suicide. No thoughts of homicide. No violent thoughts. No overwhelming anxiety.    Principal Problem: Major depressive disorder, recurrent severe without psychotic features (HCC) Diagnosis:   Patient Active Problem List   Diagnosis Date Noted  . Dental infection [K04.7] 06/13/2016  . Polysubstance (excluding opioids) dependence (HCC) [F19.20] 06/12/2016  . Major depressive disorder, recurrent severe without psychotic features (HCC) [F33.2] 06/12/2016  . Poor dental hygiene [Z91.89]    Total Time spent with patient: 20 minutes  Past Psychiatric History: As in H&P  Past  Medical History:  Past Medical History:  Diagnosis Date  . Poor dental hygiene     Past Surgical History:  Procedure Laterality Date  . SHOULDER SURGERY     Right shoulder  . SHOULDER SURGERY     Family History: History reviewed. No pertinent family history. Family Psychiatric  History: As in H&P Social History:  History  Alcohol Use  . Yes    Comment: occasionally      History  Drug Use No    Social History   Social History  . Marital status: Single    Spouse name: N/A  . Number of children: N/A  . Years of education: N/A   Social History Main Topics  . Smoking status: Current Every Day Smoker    Packs/day: 0.50    Types: Cigarettes  . Smokeless tobacco: Former Neurosurgeon    Types: Chew  . Alcohol use Yes     Comment: occasionally   . Drug use: No  . Sexual activity: Yes    Birth control/ protection: None   Other Topics Concern  . None   Social History Narrative  . None   Additional Social History:    Pain Medications: None Prescriptions: None Over the Counter: Tylenol, Ibuprophen History of alcohol / drug use?: Yes Longest period of sobriety (when/how long): unknown Negative Consequences of Use: Legal, Personal relationships, Financial Withdrawal Symptoms: Nausea / Vomiting, Diarrhea, Patient aware of relationship between substance abuse and physical/medical complications, Tremors, Sweats Name of Substance 1: ETOH 1 - Age of First Use: Teens 1 - Amount (size/oz): 1pt 1 - Frequency: three times per week 1 - Duration: ongoing 1 - Last  Use / Amount: 05/24 a pint Name of Substance 2: Benzos (xanax) is snorting them. 2 - Age of First Use: Unknown 2 - Amount (size/oz): Varies 2 - Frequency: Unknown 2 - Duration: off and on 2 - Last Use / Amount: Took about 5-6 pills today.                Sleep: Good  Appetite:  Good  Current Medications: Current Facility-Administered Medications  Medication Dose Route Frequency Provider Last Rate Last Dose  .  acamprosate (CAMPRAL) tablet 666 mg  666 mg Oral TID Georgiann CockerIzediuno, Kaylan Yates A, MD   666 mg at 06/15/16 1158  . acetaminophen (TYLENOL) tablet 650 mg  650 mg Oral Q4H PRN Charm RingsLord, Jamison Y, NP      . alum & mag hydroxide-simeth (MAALOX/MYLANTA) 200-200-20 MG/5ML suspension 30 mL  30 mL Oral Q6H PRN Charm RingsLord, Jamison Y, NP      . chlordiazePOXIDE (LIBRIUM) capsule 25 mg  25 mg Oral Q6H PRN Charm RingsLord, Jamison Y, NP      . Melene Muller[START ON 06/16/2016] chlordiazePOXIDE (LIBRIUM) capsule 25 mg  25 mg Oral Daily Charm RingsLord, Jamison Y, NP      . escitalopram (LEXAPRO) tablet 5 mg  5 mg Oral Daily Beau FannyWithrow, John C, FNP   5 mg at 06/15/16 0744  . gabapentin (NEURONTIN) capsule 200 mg  200 mg Oral BID Charm RingsLord, Jamison Y, NP   200 mg at 06/15/16 0744  . hydrOXYzine (ATARAX/VISTARIL) tablet 25 mg  25 mg Oral Q6H PRN Charm RingsLord, Jamison Y, NP   25 mg at 06/14/16 2150  . ibuprofen (ADVIL,MOTRIN) tablet 600 mg  600 mg Oral Q8H PRN Charm RingsLord, Jamison Y, NP   600 mg at 06/14/16 2150  . lisinopril (PRINIVIL,ZESTRIL) tablet 10 mg  10 mg Oral Daily Armandina StammerNwoko, Agnes I, NP   10 mg at 06/15/16 0952  . loperamide (IMODIUM) capsule 2-4 mg  2-4 mg Oral PRN Charm RingsLord, Jamison Y, NP      . magnesium hydroxide (MILK OF MAGNESIA) suspension 30 mL  30 mL Oral Daily PRN Charm RingsLord, Jamison Y, NP      . multivitamin with minerals tablet 1 tablet  1 tablet Oral Daily Charm RingsLord, Jamison Y, NP   1 tablet at 06/15/16 0744  . nicotine (NICODERM CQ - dosed in mg/24 hours) patch 21 mg  21 mg Transdermal Daily Charm RingsLord, Jamison Y, NP   21 mg at 06/15/16 0745  . ondansetron (ZOFRAN) tablet 4 mg  4 mg Oral Q8H PRN Charm RingsLord, Jamison Y, NP      . penicillin v potassium (VEETID) tablet 500 mg  500 mg Oral Q8H Withrow, John C, FNP   500 mg at 06/15/16 1353  . thiamine (VITAMIN B-1) tablet 100 mg  100 mg Oral Daily Charm RingsLord, Jamison Y, NP   100 mg at 06/15/16 0744  . traZODone (DESYREL) tablet 50 mg  50 mg Oral QHS,MR X 1 Nira ConnBerry, Jason A, NP   50 mg at 06/14/16 2150    Lab Results: No results found for this or any  previous visit (from the past 48 hour(s)).  Blood Alcohol level:  Lab Results  Component Value Date   ETH 204 (H) 06/11/2016   ETH <5 04/09/2016    Metabolic Disorder Labs: No results found for: HGBA1C, MPG No results found for: PROLACTIN No results found for: CHOL, TRIG, HDL, CHOLHDL, VLDL, LDLCALC  Physical Findings: AIMS: Facial and Oral Movements Muscles of Facial Expression: None, normal Lips and Perioral Area: None, normal Jaw: None,  normal Tongue: None, normal,Extremity Movements Upper (arms, wrists, hands, fingers): None, normal Lower (legs, knees, ankles, toes): None, normal, Trunk Movements Neck, shoulders, hips: None, normal, Overall Severity Severity of abnormal movements (highest score from questions above): None, normal Incapacitation due to abnormal movements: None, normal Patient's awareness of abnormal movements (rate only patient's report): No Awareness, Dental Status Current problems with teeth and/or dentures?: Yes Does patient usually wear dentures?: No  CIWA:  CIWA-Ar Total: 0 COWS:  COWS Total Score: 1  Musculoskeletal: Strength & Muscle Tone: within normal limits Gait & Station: normal Patient leans: N/A  Psychiatric Specialty Exam: Physical Exam  Constitutional: He is oriented to person, place, and time. He appears well-developed and well-nourished.  HENT:  Head: Normocephalic and atraumatic.  Eyes: Conjunctivae are normal. Pupils are equal, round, and reactive to light.  Neck: Normal range of motion. Neck supple.  Cardiovascular: Normal rate and regular rhythm.   Respiratory: Effort normal and breath sounds normal.  GI: Soft. Bowel sounds are normal.  Musculoskeletal: Normal range of motion.  Neurological: He is alert and oriented to person, place, and time.  Skin: Skin is warm and dry.  Psychiatric:  As above    ROS  Blood pressure 137/88, pulse (!) 113, temperature 97.8 F (36.6 C), temperature source Oral, resp. rate 16, height 5\' 7"   (1.702 m), weight 78 kg (172 lb), SpO2 100 %.Body mass index is 26.94 kg/m.  General Appearance: .Neatly dressed, pleasant, engaging well and cooperative. Appropriate behavior. Not in any distress. Good relatedness. Not internally stimulated  Eye Contact:  Good  Speech:  Spontaneous, normal prosody. Normal tone and rate.   Volume:  Normal  Mood:  Euthymic  Affect:  Appropriate and Full Range  Thought Process:  Linear  Orientation:  Full (Time, Place, and Person)  Thought Content:  No delusional theme. No preoccupation with violent thoughts. No negative ruminations. No obsession.  No hallucination in any modality.   Suicidal Thoughts:  No  Homicidal Thoughts:  No  Memory:  Immediate;   Good Recent;   Good Remote;   Good  Judgement:  Fair  Insight:  Good  Psychomotor Activity:  Normal  Concentration:  Concentration: Good and Attention Span: Good  Recall:  Good  Fund of Knowledge:  Good  Language:  Good  Akathisia:  Negative  Handed:    AIMS (if indicated):     Assets:  Communication Skills Desire for Improvement Physical Health Resilience Vocational/Educational  ADL's:  Intact  Cognition:  WNL  Sleep:  Number of Hours: 6.25     Treatment Plan Summary: Patient is not depressed. He is not manic. No dangerousness. He seems to be detoxing well. Hopeful discharge tomorrow.   Psychiatric: SUD (alcohol, benzodiazepine and THC) Substance induced mood disorder  Medical: Pancreatitis Seizure disorder  Psychosocial:  Strained relationship  PLAN: 1. Continue current regimen 2. Continue to monitor mood, behavior and interaction with peers 3.  Home tomorrow if he maintains progress.     Georgiann Cocker, MD 06/15/2016, 3:25 PMPatient ID: Charles Gardner, male   DOB: [birth year], 26 y.o.   MRN: [id number]

## 2016-06-15 NOTE — Progress Notes (Signed)
D: Pt presents with appropriate affect and mood.  He reports his day has been "awesome" and his goal is "to be like I'm supposed to be."  Pt denies SI/HI, denies hallucinations, denies pain.  He has been visible in milieu interacting with peers and staff appropriately.  A: Introduced self to pt.  Actively listened to pt and offered support and encouragement.  Medication administered per order.  Medication education provided.  Q15 minute safety checks maintained.  R: Pt is compliant with medications.  He verbally contracts for safety.  Will continue to monitor and assess.

## 2016-06-15 NOTE — Progress Notes (Addendum)
D: Spoke with patient 1:1 who is visible in the dayroom, on the unit. Patient's affect anxious with congruent mood. Patient is pleasant, cooperative. Rating depression, hopelessness and anxiety all at a 0/10. Rates sleep as fair, appetite as good, energy as normal and concentration as good.  States goal for today is to "be the man I was raised to be." Denies pain, physical problems. States antibiotic is helping tooth pain. Patient's BP elevated and has been during detox.  A: Medicated per orders, no prns requested, required. NP Nwoko notified of BP and order received for lisinopril 10mg  x 5 days. Emotional support offered and self inventory reviewed. Encouraged completion of Suicide Safety Plan. Discussed POC with MD, SW.   R: Patient verbalizes understanding of POC. Patient denies SI/HI and remains safe on level III obs. Plans to discharge to parents once ready. Reports relief that marriage is ending.

## 2016-06-15 NOTE — Progress Notes (Signed)
Recreation Therapy Notes  Date: 06/15/16 Time: 0930 Location: [address] Dayroom  Group Topic: Stress Management  Goal Area(s) Addresses:  Patient will verbalize importance of using healthy stress management.  Patient will identify positive emotions associated with healthy stress management.   Behavioral Response: Engaged  Intervention: Stress Management  Activity :  Guided Imagery.  LRT introduced the stress management concept of guided imagery.  LRT read a script to allow patients to engage in guided imagery.  Patients were to follow along to fully participate in the activity.  Education:  Stress Management, Discharge Planning.   Education Outcome: Acknowledges edcuation/In group clarification offered/Needs additional education  Clinical Observations/Feedback: Pt attended group.   Alexyss Balzarini, LRT/CTRS         Benecio Kluger A 06/15/2016 11:41 AM 

## 2016-06-15 NOTE — [facility] Group Notes (Signed)
[facility]   06/15/2016  8:45 AM  Participation Quality:  Active  Mood/Affect:  Appropriate   Plan for Discharge/Comments: Pt states that he is feeling "great" today. Pt reports that he is looking to discharge as soon as possible and plans to follow up with the [facility].  Transportation Means: Pt has access to transporation  Supports: Mental Health supports   Charles JordanLynn B Tyronza Gardner, MSW, LCSWA 06/15/2016 1:17 PM

## 2016-06-15 NOTE — Plan of Care (Signed)
Problem: Activity: Goal: Sleeping patterns will improve Outcome: Progressing Pt slept 6.25 hours last night according to flowsheet.    

## 2016-06-15 NOTE — Progress Notes (Signed)
D    Pt is appropriate and pleasant on approach   He endorses depression and anxiety   Reports his teeth are bothering him and requested medication for same  He is compliant with treatment  Pt reports he will be living with his parents after discharge A    Verbal support given   Medications administered and effectiveness monitored    Educate on medications   Q 15 min checks R  Pt is safe at present

## 2016-06-15 NOTE — Progress Notes (Signed)
Pt attended AA group. Charles Gardner 06/15/2016  23:58

## 2016-06-15 NOTE — Progress Notes (Signed)
Patient has had good day, compliant with meds. Continues to state he feels relief that marriage is ending. "Between fighting with her and her having pain pills, it's just a win win for us to be apart." Affect more animated, mood less anxious. Remain safe at present and is looking forward to probable discharge tomorrow.

## 2016-06-15 NOTE — Plan of Care (Signed)
Problem: Medication: Goal: Compliance with prescribed medication regimen will improve Outcome: Progressing Patient is med compliant   

## 2016-06-16 DIAGNOSIS — G40909 Epilepsy, unspecified, not intractable, without status epilepticus: Secondary | ICD-10-CM

## 2016-06-16 DIAGNOSIS — F17210 Nicotine dependence, cigarettes, uncomplicated: Secondary | ICD-10-CM

## 2016-06-16 DIAGNOSIS — Z639 Problem related to primary support group, unspecified: Secondary | ICD-10-CM

## 2016-06-16 DIAGNOSIS — K859 Acute pancreatitis without necrosis or infection, unspecified: Secondary | ICD-10-CM

## 2016-06-16 MED ORDER — HYDROXYZINE HCL 25 MG PO TABS
25.0000 mg | ORAL_TABLET | Freq: Four times a day (QID) | ORAL | Status: DC | PRN
  Administered 2016-06-16: 25 mg via ORAL
  Filled 2016-06-16: qty 30
  Filled 2016-06-16: qty 1

## 2016-06-16 MED ORDER — TRAZODONE HCL 150 MG PO TABS
150.0000 mg | ORAL_TABLET | Freq: Every day | ORAL | Status: DC
  Administered 2016-06-16: 150 mg via ORAL
  Filled 2016-06-16: qty 1
  Filled 2016-06-16: qty 22
  Filled 2016-06-16: qty 1

## 2016-06-16 NOTE — [facility] Suicide Risk Assessment (Signed)
[facility] INPATIENT:  Family/Significant Other Suicide Prevention Education  Suicide Prevention Education:  Contact Attempts: [name] (wife [phone]), has been identified by the patient as the family member/significant other with whom the patient will be residing, and identified as the person(s) who will aid the patient in the event of a mental health crisis.  With written consent from the patient, two attempts were made to provide suicide prevention education, prior to and/or following the patient's discharge.  We were unsuccessful in providing suicide prevention education.  A suicide education pamphlet was given to the patient to share with family/significant other.  Date and time of second attempt: 06/16/16 @ 11:49am. No answer, CSW left a message.  Charles Gardner, MSW, LCSWA 06/16/2016, 11:50 AM

## 2016-06-16 NOTE — Tx Team (Signed)
Interdisciplinary Treatment and Diagnostic Plan Update 06/16/2016 Time of Session: 9:30am  Charles Gardner  MRN: [id number]  Principal Diagnosis: Major depressive disorder, recurrent severe without psychotic features (HCC)  Secondary Diagnoses: Principal Problem:   Major depressive disorder, recurrent severe without psychotic features (HCC) Active Problems:   Poor dental hygiene   Polysubstance (excluding opioids) dependence (HCC)   Dental infection   Current Medications:  Current Facility-Administered Medications  Medication Dose Route Frequency Provider Last Rate Last Dose  . acamprosate (CAMPRAL) tablet 666 mg  666 mg Oral TID Georgiann Cocker, MD   666 mg at 06/16/16 0829  . acetaminophen (TYLENOL) tablet 650 mg  650 mg Oral Q4H PRN Charm Rings, NP      . alum & mag hydroxide-simeth (MAALOX/MYLANTA) 200-200-20 MG/5ML suspension 30 mL  30 mL Oral Q6H PRN Charm Rings, NP      . escitalopram (LEXAPRO) tablet 5 mg  5 mg Oral Daily Withrow, John C, FNP   5 mg at 06/16/16 0830  . gabapentin (NEURONTIN) capsule 200 mg  200 mg Oral BID Charm Rings, NP   200 mg at 06/16/16 5284  . ibuprofen (ADVIL,MOTRIN) tablet 600 mg  600 mg Oral Q8H PRN Charm Rings, NP   600 mg at 06/14/16 2150  . lisinopril (PRINIVIL,ZESTRIL) tablet 10 mg  10 mg Oral Daily Armandina Stammer I, NP   10 mg at 06/16/16 0829  . magnesium hydroxide (MILK OF MAGNESIA) suspension 30 mL  30 mL Oral Daily PRN Charm Rings, NP      . multivitamin with minerals tablet 1 tablet  1 tablet Oral Daily Charm Rings, NP   1 tablet at 06/16/16 [phone]  . nicotine (NICODERM CQ - dosed in mg/24 hours) patch 21 mg  21 mg Transdermal Daily Charm Rings, NP   21 mg at 06/16/16 4010  . ondansetron (ZOFRAN) tablet 4 mg  4 mg Oral Q8H PRN Charm Rings, NP      . penicillin v potassium (VEETID) tablet 500 mg  500 mg Oral Q8H Withrow, John C, FNP   500 mg at 06/16/16 2725  . thiamine (VITAMIN B-1) tablet 100 mg  100 mg Oral  Daily Charm Rings, NP   100 mg at 06/16/16 3664  . traZODone (DESYREL) tablet 50 mg  50 mg Oral QHS,MR X 1 Nira Conn A, NP   50 mg at 06/15/16 2104    PTA Medications: Prescriptions Prior to Admission  Medication Sig Dispense Refill Last Dose  . acetaminophen (TYLENOL) 500 MG tablet Take 500 mg by mouth every 6 (six) hours as needed for moderate pain.   Past Week at Unknown time  . diphenhydrAMINE (BENADRYL) 25 MG tablet Take 25 mg by mouth every 6 (six) hours as needed for sleep.   Past Week at Unknown time  . ibuprofen (ADVIL,MOTRIN) 800 MG tablet Take 1 tablet (800 mg total) by mouth 3 (three) times daily. 30 tablet 0 unknown    Treatment Modalities: Medication Management, Group therapy, Case management,  1 to 1 session with clinician, Psychoeducation, Recreational therapy.  Patient Stressors: Paediatric nurse issue Marital or family conflict Occupational concerns Substance abuse Patient Strengths: Wellsite geologist fund of knowledge Physical Health  Physician Treatment Plan for Primary Diagnosis: Major depressive disorder, recurrent severe without psychotic features (HCC) Long Term Goal(s): Improvement in symptoms so as ready for discharge Short Term Goals: Ability to identify changes in lifestyle to reduce recurrence of condition will improve  Ability to verbalize feelings will improve Ability to disclose and discuss suicidal ideas Ability to demonstrate self-control will improve Ability to identify and develop effective coping behaviors will improve Ability to maintain clinical measurements within normal limits will improve Compliance with prescribed medications will improve Ability to identify triggers associated with substance abuse/mental health issues will improve Ability to identify changes in lifestyle to reduce recurrence of condition will improve Ability to verbalize feelings will improve Ability to disclose and discuss suicidal  ideas Ability to demonstrate self-control will improve Ability to identify and develop effective coping behaviors will improve Ability to maintain clinical measurements within normal limits will improve Compliance with prescribed medications will improve Ability to identify triggers associated with substance abuse/mental health issues will improve  Medication Management: Evaluate patient's response, side effects, and tolerance of medication regimen.  Therapeutic Interventions: 1 to 1 sessions, Unit Group sessions and Medication administration.  Evaluation of Outcomes: Progressing  Physician Treatment Plan for Secondary Diagnosis: Principal Problem:   Major depressive disorder, recurrent severe without psychotic features (HCC) Active Problems:   Poor dental hygiene   Polysubstance (excluding opioids) dependence (HCC)   Dental infection  Long Term Goal(s): Improvement in symptoms so as ready for discharge  Short Term Goals: Ability to identify changes in lifestyle to reduce recurrence of condition will improve Ability to verbalize feelings will improve Ability to disclose and discuss suicidal ideas Ability to demonstrate self-control will improve Ability to identify and develop effective coping behaviors will improve Ability to maintain clinical measurements within normal limits will improve Compliance with prescribed medications will improve Ability to identify triggers associated with substance abuse/mental health issues will improve Ability to identify changes in lifestyle to reduce recurrence of condition will improve Ability to verbalize feelings will improve Ability to disclose and discuss suicidal ideas Ability to demonstrate self-control will improve Ability to identify and develop effective coping behaviors will improve Ability to maintain clinical measurements within normal limits will improve Compliance with prescribed medications will improve Ability to identify  triggers associated with substance abuse/mental health issues will improve  Medication Management: Evaluate patient's response, side effects, and tolerance of medication regimen.  Therapeutic Interventions: 1 to 1 sessions, Unit Group sessions and Medication administration.  Evaluation of Outcomes: Progressing  RN Treatment Plan for Primary Diagnosis: Major depressive disorder, recurrent severe without psychotic features (HCC) Long Term Goal(s): Knowledge of disease and therapeutic regimen to maintain health will improve  Short Term Goals: Compliance with prescribed medications will improve  Medication Management: RN will administer medications as ordered by provider, will assess and evaluate patient's response and provide education to patient for prescribed medication. RN will report any adverse and/or side effects to prescribing provider.  Therapeutic Interventions: 1 on 1 counseling sessions, Psychoeducation, Medication administration, Evaluate responses to treatment, Monitor vital signs and CBGs as ordered, Perform/monitor CIWA, COWS, AIMS and Fall Risk screenings as ordered, Perform wound care treatments as ordered.  Evaluation of Outcomes: Progressing  LCSW Treatment Plan for Primary Diagnosis: Major depressive disorder, recurrent severe without psychotic features (HCC) Long Term Goal(s): Safe transition to appropriate next level of care at discharge, Engage patient in therapeutic group addressing interpersonal concerns. Short Term Goals: Engage patient in aftercare planning with referrals and resources, Facilitate patient progression through stages of change regarding substance use diagnoses and concerns, Identify triggers associated with mental health/substance abuse issues and Increase skills for wellness and recovery  Therapeutic Interventions: Assess for all discharge needs, 1 to 1 time with Social worker, Explore available resources and  support systems, Assess for adequacy in  community support network, Educate family and significant other(s) on suicide prevention, Complete Psychosocial Assessment, Interpersonal group therapy.  Evaluation of Outcomes: Progressing  Progress in Treatment: Attending groups: Yes Participating in groups: Minimally Taking medication as prescribed: Yes, MD continues to assess for medication changes as needed Toleration medication: Yes, no side effects reported at this time Family/Significant other contact made: No, CSW attempting contact with pt's wife. Patient understands diagnosis: Continuing to assess Discussing patient identified problems/goals with staff: Yes Medical problems stabilized or resolved: Yes Denies suicidal/homicidal ideation: Yes Issues/concerns per patient self-inventory: None Other: N/A  New problem(s) identified: None identified at this time.   New Short Term/Long Term Goal(s): None identified at this time.   Discharge Plan or Barriers: Pt will discharge to [facility] for inpt treatment and then follow-up outpatient with [facility].  Reason for Continuation of Hospitalization:   Depression Medication stabilization Suicidal ideation Withdrawal symptoms  Estimated Length of Stay: 1-4 days  Attendees: Patient: 06/16/2016 11:37 AM  Physician: Dr. Elna Breslow 06/16/2016 11:37 AM  Nursing: Rayfield Citizen RN; [place], RN 06/16/2016 11:37 AM  RN Care Manager: Onnie Boer, RN 06/16/2016 11:37 AM  Social Worker: Vernie Shanks, LCSW; Donnelly Stager, LCSWA 06/16/2016 11:37 AM  Recreational Therapist:  06/16/2016 11:37 AM  Other: Armandina Stammer, NP; Gray Bernhardt, NP 06/16/2016 11:37 AM  Other:  06/16/2016 11:37 AM  Other: 06/16/2016 11:37 AM   Scribe for Treatment Team: Jonathon Jordan, MSW,LCSWA 06/16/2016 11:37 AM

## 2016-06-16 NOTE — [facility] Group Notes (Signed)
[facility] LCSW Group Therapy  06/16/2016 1:15pm  Type of Therapy: Group Therapy   Topic: Overcoming Obstacles  Participation Level: Active  Participation Quality: Appropriate   Affect: Appropriate  Cognitive: Appropriate and Oriented  Insight: Developing/Improving and Improving  Engagement in Therapy: Improving  Modes of Intervention: Discussion, Exploration, Problem-solving and Support  Description of Group:  In this group patients will be encouraged to explore what they see as obstacles to their own wellness and recovery. They will be guided to discuss their thoughts, feelings, and behaviors related to these obstacles. The group will process together ways to cope with barriers, with attention given to specific choices patients can make. Each patient will be challenged to identify changes they are motivated to make in order to overcome their obstacles. This group will be process-oriented, with patients participating in exploration of their own experiences as well as giving and receiving support and challenge from other group members.  Summary of Patient Progress:  Pt states that his biggest obstacle is that he always gets into toxic relationships. He acts on impulse with women instead of thinking things through logically. Pt states that he plans to end his relationship cold [place] with the woman he was dating before he came into the hospital. Pt states that ending the relationship will not be a problem for him.  Therapeutic Modalities:  Cognitive Behavioral Therapy Solution Focused Therapy Motivational Interviewing Relapse Prevention Therapy  Jonathon JordanLynn B Riddick Nuon, MSW, Theresia MajorsLCSWA [phone]

## 2016-06-16 NOTE — Progress Notes (Signed)
D: Pt presents anxious on approach. Pt denies SI. Pt reports decreased depression and anxiety. Pt denies withdrawal symptoms and cravings. Pt requesting to be discharged home today. Pt stated that he has learned a valuable lesson about mixing alcohol and benzo's together and plans to attend substance abuse classes.  A: Medications reviewed with pt. Medications administered as ordered per MD. Verbal support provided. Pt encouraged to attend groups. 15 minute checks performed for safety. Nicole KindredAgnes, NP., made aware of pt request to discharge home today. R: Pt compliant with tx.

## 2016-06-16 NOTE — Progress Notes (Signed)
D: Pt was in the dayroom upon initial approach.  Pt presents with appropriate affect and anxious mood.  Pt reports his day was "awesome" and "the social worker is working on a referral for when I get out."  Pt reports he will be going to [facility]  Pt denies SI/HI, denies hallucinations, denies pain.  Pt has been visible in milieu interacting with peers and staff appropriately.  Pt attended evening group.    A: Introduced self to pt.  Actively listened to pt and offered support and encouragement. Medications administered per order.  PRN medication administered for anxiety.  Q15 minute safety checks maintained.  R: Pt is safe on the unit.  Pt is compliant with medications.  Pt verbally contracts for safety.  Will continue to monitor and assess.

## 2016-06-16 NOTE — Progress Notes (Signed)
Recreation Therapy Notes  Animal-Assisted Activity (AAA) Program Checklist/Progress Notes Patient Eligibility Criteria Checklist & Daily Group note for Rec TxIntervention  Date: 06/16/2016 Time: 2:55pm Location: 400 hall dayroom   AAA/T Program Assumption of Risk Form signed by Patient/ or Parent Legal Guardian Yes  Patient is free of allergies or sever asthma Yes  Patient reports no fear of animals Yes  Patient reports no history of cruelty to animals Yes  Patient understands his/her participation is voluntary Yes  Patient washes hands before animal contact Yes  Patient washes hands after animal contact Yes  Behavioral Response: Appropriate  Education:Hand Washing, Appropriate Animal Interaction   Education Outcome: Acknowledges education.   Clinical Observations/Feedback: Patient attended session and interacted appropriately with therapy dog and peers. Patient asked appropriate questions about therapy dog and his training.   Charles Gardner, Recreational Therapy Intern        Charles Gardner 06/16/2016 3:24 PM

## 2016-06-16 NOTE — Progress Notes (Signed)
[facility]  06/16/2016 4:42 PM Charles HornsBrandon Gardner  MRN:  [id number]  Subjective: I'm doing well. I need to go".  Objective: 26 yo Caucasian male, married, lives with his family. Background history of SUD. Drinks and uses xanax on a daily basis. Presented to the ER via the police. Committed involuntarily on account of suicidal thoughts. Expressed plans to slit self with a pocket knife. Expressed plans to obtain a shotgun and end his own life. BAL was 206 mg/dl. UDS was positive for THC and benzodiazepines. AST and ALT are mildly elevated.   Chart reviewed today. Patient discussed at team  Nursing staff reports that patient has been appropriate on the unit. Patient has been interacting well with peers. No behavioral issues. Patient has not voiced any suicidal thoughts. Patient has not been observed to be internally stimulated. Patient has been adherent with treatment recommendations. Patient has been tolerating their medication well.   Seen today. Reports that he is in good spirits. Not feeling depressed. Reports normal energy and interest. Has been maintaining normal biological functions. He is able to think clearly. He is able to focus on task. His thoughts are not crowded or racing. No evidence of mania. No hallucination in any modality. He is not making any delusional statement. No passivity of will/thought. He is fully in touch with reality. No thoughts of suicide. No thoughts of homicide. No violent thoughts. No overwhelming anxiety.  Principal Problem: Major depressive disorder, recurrent severe without psychotic features (HCC)  Diagnosis:   Patient Active Problem List   Diagnosis Date Noted  . Dental infection [K04.7] 06/13/2016  . Polysubstance (excluding opioids) dependence (HCC) [F19.20] 06/12/2016  . Major depressive disorder, recurrent severe without psychotic features (HCC) [F33.2] 06/12/2016  . Poor dental hygiene [Z91.89]    Total Time spent with patient: 15  minutes  Past Psychiatric History: As in H&P  Past Medical History:  Past Medical History:  Diagnosis Date  . Poor dental hygiene     Past Surgical History:  Procedure Laterality Date  . SHOULDER SURGERY     Right shoulder  . SHOULDER SURGERY     Family History: History reviewed. No pertinent family history.  Family Psychiatric  History: As in H&P  Social History:  History  Alcohol Use  . Yes    Comment: occasionally      History  Drug Use No    Social History   Social History  . Marital status: Single    Spouse name: N/A  . Number of children: N/A  . Years of education: N/A   Social History Main Topics  . Smoking status: Current Every Day Smoker    Packs/day: 0.50    Types: Cigarettes  . Smokeless tobacco: Former NeurosurgeonUser    Types: Chew  . Alcohol use Yes     Comment: occasionally   . Drug use: No  . Sexual activity: Yes    Birth control/ protection: None   Other Topics Concern  . None   Social History Narrative  . None   Additional Social History:    Pain Medications: None Prescriptions: None Over the Counter: Tylenol, Ibuprophen History of alcohol / drug use?: Yes Longest period of sobriety (when/how long): unknown Negative Consequences of Use: Legal, Personal relationships, Financial Withdrawal Symptoms: Nausea / Vomiting, Diarrhea, Patient aware of relationship between substance abuse and physical/medical complications, Tremors, Sweats Name of Substance 1: ETOH 1 - Age of First Use: Teens 1 - Amount (size/oz): 1pt 1 - Frequency: three times  per week 1 - Duration: ongoing 1 - Last Use / Amount: 05/24 a pint Name of Substance 2: Benzos (xanax) is snorting them. 2 - Age of First Use: Unknown 2 - Amount (size/oz): Varies 2 - Frequency: Unknown 2 - Duration: off and on 2 - Last Use / Amount: Took about 5-6 pills today.  Sleep: Good  Appetite:  Good  Current Medications: Current Facility-Administered Medications  Medication Dose Route  Frequency Provider Last Rate Last Dose  . acamprosate (CAMPRAL) tablet 666 mg  666 mg Oral TID Georgiann Cocker, MD   666 mg at 06/16/16 1203  . acetaminophen (TYLENOL) tablet 650 mg  650 mg Oral Q4H PRN Charm Rings, NP      . alum & mag hydroxide-simeth (MAALOX/MYLANTA) 200-200-20 MG/5ML suspension 30 mL  30 mL Oral Q6H PRN Charm Rings, NP      . escitalopram (LEXAPRO) tablet 5 mg  5 mg Oral Daily Withrow, John C, FNP   5 mg at 06/16/16 0830  . gabapentin (NEURONTIN) capsule 200 mg  200 mg Oral BID Charm Rings, NP   200 mg at 06/16/16 9147  . ibuprofen (ADVIL,MOTRIN) tablet 600 mg  600 mg Oral Q8H PRN Charm Rings, NP   600 mg at 06/14/16 2150  . lisinopril (PRINIVIL,ZESTRIL) tablet 10 mg  10 mg Oral Daily Armandina Stammer I, NP   10 mg at 06/16/16 0829  . magnesium hydroxide (MILK OF MAGNESIA) suspension 30 mL  30 mL Oral Daily PRN Charm Rings, NP      . multivitamin with minerals tablet 1 tablet  1 tablet Oral Daily Charm Rings, NP   1 tablet at 06/16/16 [phone]  . nicotine (NICODERM CQ - dosed in mg/24 hours) patch 21 mg  21 mg Transdermal Daily Charm Rings, NP   21 mg at 06/16/16 6213  . ondansetron (ZOFRAN) tablet 4 mg  4 mg Oral Q8H PRN Charm Rings, NP      . penicillin v potassium (VEETID) tablet 500 mg  500 mg Oral Q8H Withrow, John C, FNP   500 mg at 06/16/16 1455  . thiamine (VITAMIN B-1) tablet 100 mg  100 mg Oral Daily Charm Rings, NP   100 mg at 06/16/16 0865  . traZODone (DESYREL) tablet 150 mg  150 mg Oral QHS Nwoko, Agnes I, NP       Lab Results: No results found for this or any previous visit (from the past 48 hour(s)).  Blood Alcohol level:  Lab Results  Component Value Date   ETH 204 (H) 06/11/2016   ETH <5 04/09/2016   Metabolic Disorder Labs: No results found for: HGBA1C, MPG No results found for: PROLACTIN No results found for: CHOL, TRIG, HDL, CHOLHDL, VLDL, LDLCALC  Physical Findings: AIMS: Facial and Oral Movements Muscles of  Facial Expression: None, normal Lips and Perioral Area: None, normal Jaw: None, normal Tongue: None, normal,Extremity Movements Upper (arms, wrists, hands, fingers): None, normal Lower (legs, knees, ankles, toes): None, normal, Trunk Movements Neck, shoulders, hips: None, normal, Overall Severity Severity of abnormal movements (highest score from questions above): None, normal Incapacitation due to abnormal movements: None, normal Patient's awareness of abnormal movements (rate only patient's report): No Awareness, Dental Status Current problems with teeth and/or dentures?: Yes Does patient usually wear dentures?: No  CIWA:  CIWA-Ar Total: 0 COWS:  COWS Total Score: 1  Musculoskeletal: Strength & Muscle Tone: within normal limits Gait & Station: normal Patient leans: N/A  Psychiatric Specialty Exam: Physical Exam  Constitutional: He is oriented to person, place, and time. He appears well-developed and well-nourished.  HENT:  Head: Normocephalic and atraumatic.  Eyes: Conjunctivae are normal. Pupils are equal, round, and reactive to light.  Neck: Normal range of motion. Neck supple.  Cardiovascular: Normal rate and regular rhythm.   Respiratory: Effort normal and breath sounds normal.  GI: Soft. Bowel sounds are normal.  Musculoskeletal: Normal range of motion.  Neurological: He is alert and oriented to person, place, and time.  Skin: Skin is warm and dry.  Psychiatric:  As above    ROS: Nurses notes & Vital signs reviewed.  Blood pressure 128/77, pulse 66, temperature 97.6 F (36.4 C), temperature source Oral, resp. rate 18, height 5\' 7"  (1.702 m), weight 78 kg (172 lb), SpO2 100 %.Body mass index is 26.94 kg/m.  General Appearance: .Neatly dressed, pleasant, engaging well and cooperative. Appropriate behavior. Not in any distress. Good relatedness. Not internally stimulated  Eye Contact:  Good  Speech:  Spontaneous, normal prosody. Normal tone and rate.   Volume:  Normal   Mood:  Euthymic  Affect:  Appropriate and Full Range  Thought Process:  Linear  Orientation:  Full (Time, Place, and Person)  Thought Content:  No delusional theme. No preoccupation with violent thoughts. No negative ruminations. No obsession.  No hallucination in any modality.   Suicidal Thoughts:  No  Homicidal Thoughts:  No  Memory:  Immediate;   Good Recent;   Good Remote;   Good  Judgement:  Fair  Insight:  Good  Psychomotor Activity:  Normal  Concentration:  Concentration: Good and Attention Span: Good  Recall:  Good  Fund of Knowledge:  Good  Language:  Good  Akathisia:  Negative  Handed:    AIMS (if indicated):     Assets:  Communication Skills Desire for Improvement Physical Health Resilience Vocational/Educational  ADL's:  Intact  Cognition:  WNL  Sleep:  Number of Hours: 4.25   Treatment Plan Summary: Patient is not depressed. He is not manic. No dangerousness. He seems to be detoxing well. Hopeful discharge tomorrow.   Will continue today 06/16/16 plan as below except where it is noted.  Psychiatric: SUD (alcohol, benzodiazepine and THC) Substance induced mood disorder  Medical: Pancreatitis Seizure disorder  Psychosocial:  Strained relationship  PLAN: 1. Continue current regimen: Continue Campral 666 mg for alcoholism. Continue Librium 25 mg prn for CIWA. Continue Lexapro 5 mg for depression. Continue Gabapentin 200 mg for agitation. Continue Trazodone 150 mg for insomnia.  2. Continue to monitor mood, behavior and interaction with peers 3.  Home tomorrow if he maintains progress.    Sanjuana Kava, NP, PMHNP, FNP-BC 06/16/2016, 4:42 PMPatient ID: Charles Gardner, male   DOB: 06-21-1990, 26 y.o.   MRN: [id number]

## 2016-06-17 MED ORDER — ACAMPROSATE CALCIUM 333 MG PO TBEC: 666.0000 mg | DELAYED_RELEASE_TABLET | Freq: Three times a day (TID) | ORAL | 0 refills | Status: DC

## 2016-06-17 MED ORDER — ESCITALOPRAM OXALATE 5 MG PO TABS: 5.0000 mg | ORAL_TABLET | Freq: Every day | ORAL | 0 refills | Status: DC

## 2016-06-17 MED ORDER — GABAPENTIN 100 MG PO CAPS: 200.0000 mg | ORAL_CAPSULE | Freq: Two times a day (BID) | ORAL | 0 refills | Status: DC

## 2016-06-17 MED ORDER — LISINOPRIL 10 MG PO TABS: 10.0000 mg | ORAL_TABLET | Freq: Every day | ORAL | 0 refills | Status: DC

## 2016-06-17 MED ORDER — ACETAMINOPHEN 500 MG PO TABS: 500.0000 mg | ORAL_TABLET | Freq: Four times a day (QID) | ORAL | 0 refills | Status: DC | PRN

## 2016-06-17 MED ORDER — TRAZODONE HCL 150 MG PO TABS: 150.0000 mg | ORAL_TABLET | Freq: Every day | ORAL | 0 refills | Status: DC

## 2016-06-17 MED ORDER — PENICILLIN V POTASSIUM 500 MG PO TABS: 500.0000 mg | ORAL_TABLET | Freq: Three times a day (TID) | ORAL | Status: DC

## 2016-06-17 MED ORDER — IBUPROFEN 800 MG PO TABS: 800.0000 mg | ORAL_TABLET | Freq: Three times a day (TID) | ORAL | 0 refills | Status: DC

## 2016-06-17 MED ORDER — HYDROXYZINE HCL 25 MG PO TABS: 25.0000 mg | ORAL_TABLET | Freq: Four times a day (QID) | ORAL | 0 refills | Status: DC | PRN

## 2016-06-17 MED ORDER — NICOTINE 21 MG/24HR TD PT24: 21.0000 mg | MEDICATED_PATCH | Freq: Every day | TRANSDERMAL | 0 refills | Status: DC
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
## 2016-06-17 NOTE — Progress Notes (Signed)
  [facility] :  Will you be returning to the same living situation after discharge:  Yes,  pt returning home. At discharge, do you have transportation home?: Yes,  pt has access to transportation. Do you have the ability to pay for your medications: Yes,  prescriptions and samples provided.  Release of information consent forms completed and in the chart;  Patient's signature needed at discharge.  Patient to Follow up at: Follow-up Information    [facility]. Go on 06/22/2016.   Why:  Social worker has made a referral on your behalf. Please arrive for your screening appointment on this date no later than 7:45am. Please be sure to bring a 2 week supply of medications and 2 weeks of prescriptions. Thank you. Contact information: [place] [address] [place] [address] [phone]        [facility] Follow up.   Specialty:  Behavioral Health Why:  Please call to schedule an appointment with your outpatient provider after you finish treatment with [facility]. Contact information: [address] [place] [address] [phone]           Next level of care provider [facility] Link:no  Safety Planning and Suicide Prevention discussed: Yes,  with pt.  Have you used any form of tobacco in the last 30 days? (Cigarettes, Smokeless Tobacco, Cigars, and/or Pipes): Yes  Has patient been referred to the Quitline?: Patient refused referral  Patient has been referred for addiction treatment: Yes  Charles JordanLynn B Olanrewaju Gardner, MSW, LCSWA 06/17/2016, 10:00 AM

## 2016-06-17 NOTE — Progress Notes (Signed)
Recreation Therapy Notes  Date: 06/17/16 Time: 0930 Location: [address] Dayroom  Group Topic: Stress Management  Goal Area(s) Addresses:  Patient will verbalize importance of using healthy stress management.  Patient will identify positive emotions associated with healthy stress management.   Behavioral Response: Engaged  Intervention: Stress Management  Activity :  Body Scan Meditation.  LRT introduced the stress management technique of meditation.  LRT played a meditation to allow patients to take inventory of the sensations they are feeling in their body.  Patients were to follow along as the meditation was played to fully engage in the technique.  Education:  Stress Management, Discharge Planning.   Education Outcome: Acknowledges edcuation/In group clarification offered/Needs additional education  Clinical Observations/Feedback: Pt attended group.   Charles Gardner, LRT/CTRS         Everlean Bucher A 06/17/2016 11:27 AM 

## 2016-06-17 NOTE — Progress Notes (Signed)
Discharge note: Pt received both written and verbally discharge instructions. Pt verbalized understanding of discharge instructions. Pt agreed to f/u appt and med regimen. Pt received samples meds for 3 weeks. Writer explained to pt that [facility] Pt provided with prescriptions, SRA, AVS and transitional record. Pt gathered belongings from room and locker. Pt safely discharged to the lobby.

## 2016-06-17 NOTE — Progress Notes (Signed)
Adult Psychoeducational Group Note  Date:  06/17/2016 Time:  2:32 AM  Group Topic/Focus:  Wrap-Up Group:   The focus of this group is to help patients review their daily goal of treatment and discuss progress on daily workbooks.  Participation Level:  Active  Participation Quality:  Appropriate, Attentive and Sharing  Affect:  Appropriate  Cognitive:  Appropriate  Insight: Appropriate and Good  Engagement in Group:  Developing/Improving  Modes of Intervention:  Discussion  Additional Comments:  Pt shared during group and stated [facility] and is going to separate from his wife.   Karleen HampshireFox, Darely Becknell Brittini 06/17/2016, 2:32 AM
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
## 2021-10-30 DIAGNOSIS — Z87892 Personal history of anaphylaxis: Secondary | ICD-10-CM | POA: Diagnosis not present

## 2021-10-30 DIAGNOSIS — I10 Essential (primary) hypertension: Secondary | ICD-10-CM | POA: Diagnosis not present

## 2021-10-30 DIAGNOSIS — I861 Scrotal varices: Secondary | ICD-10-CM | POA: Diagnosis not present

## 2021-10-30 DIAGNOSIS — Z8619 Personal history of other infectious and parasitic diseases: Secondary | ICD-10-CM | POA: Diagnosis not present

## 2021-11-13 DIAGNOSIS — F909 Attention-deficit hyperactivity disorder, unspecified type: Secondary | ICD-10-CM | POA: Diagnosis not present

## 2021-11-27 ENCOUNTER — Ambulatory Visit: Payer: Self-pay | Admitting: Urology

## 2021-11-27 ENCOUNTER — Encounter: Payer: Self-pay | Admitting: Urology

## 2021-11-27 ENCOUNTER — Ambulatory Visit (INDEPENDENT_AMBULATORY_CARE_PROVIDER_SITE_OTHER): Payer: BC Managed Care – PPO | Admitting: Urology

## 2021-11-27 VITALS — BP 132/83 | HR 68

## 2021-11-27 DIAGNOSIS — I861 Scrotal varices: Secondary | ICD-10-CM | POA: Diagnosis not present

## 2021-11-27 NOTE — Patient Instructions (Signed)
Varicocelectomy  Varicocelectomy is a procedure to treat a varicocele. A varicocele is a swelling of veins inside the pouch that holds the testicles (scrotum). You may need this surgery if a varicocele is causing pain, shrinking your testicle, or making it hard for you to father a child (infertility). In this surgery, the swollen veins are tied off so that the flow of blood goes to other veins instead. The surgery may be done in different ways, including: Open traditional. The repair is done through one large incision. Laparoscopic. The repair is done with the help of a thin, lighted tube that has a camera (laparoscope). The scope and other tools are inserted through a few small incisions. Microscopic. A microscope is used to find all of the small veins that need repair. Your surgeon will help you decide which approach is best for your condition. Tell a health care provider about: Any allergies you have. All medicines you are taking, including vitamins, herbs, eye drops, creams, and over-the-counter medicines. Any problems you or family members have had with anesthetic medicines. Any blood disorders you have. Any surgeries you have had. Any medical conditions you have. What are the risks? Generally, this is a safe procedure. However, problems may occur, including: Bleeding. Infection. Allergic reactions to medicines. Damage to the testicle. Damage to the blood vessels that supply the testicle. Fluid buildup in the scrotum (hydrocele). What happens before the procedure? Staying hydrated Follow instructions from your health care provider about hydration, which may include: Up to 2 hours before the procedure - you may continue to drink clear liquids, such as water, clear fruit juice, black coffee, and plain tea. Eating and drinking restrictions Follow instructions from your health care provider about eating and drinking, which may include: 8 hours before the procedure - stop eating heavy  meals or foods, such as meat, fried foods, or fatty foods. 6 hours before the procedure - stop eating light meals or foods, such as toast or cereal. 6 hours before the procedure - stop drinking milk or drinks that contain milk. 2 hours before the procedure - stop drinking clear liquids. Medicines Ask your health care provider about: Changing or stopping your regular medicines. This is especially important if you are taking diabetes medicines or blood thinners. Taking medicines such as aspirin and ibuprofen. These medicines can thin your blood. Do not take these medicines unless your health care provider tells you to take them. Taking over-the-counter medicines, vitamins, herbs, and supplements. General instructions Ask your health care provider: How your surgery site will be marked. What steps will be taken to help prevent infection. These may include: Removing hair at the surgery site. Washing skin with a germ-killing soap. Taking antibiotic medicine. Do not use any products that contain nicotine or tobacco for at least 4 weeks before the procedure. These products include cigarettes, e-cigarettes, and chewing tobacco. If you need help quitting, ask your health care provider. Plan to have someone take you home from the hospital or clinic. What happens during the procedure? An IV will be inserted into one of your veins. You will be given one or more of the following: A medicine to help you relax (sedative). A medicine to numb the area (local anesthetic). A medicine to make you fall asleep (general anesthetic). One to three incisions will be made. The size, location, and number of incisions will depend on the technique and approach used by the surgeon. Incisions may be made in any of these areas: Abdomen (retroperitoneal approach). Groin (inguinal  approach). Below the groin (subinguinal approach). The veins will be clipped or cut and tied off. The incisions will be closed with stitches  (sutures). Small adhesive strips may also be placed over the incisions. The incision area will be covered with a light bandage (dressing). The procedure may vary among health care providers and hospitals. What happens after the procedure? Your blood pressure, heart rate, breathing rate, and blood oxygen level will be monitored until you leave the hospital or clinic. You will be given pain medicine as needed. You may have to wear an athletic support strap to hold the dressing in place and to support your scrotum. Do not drive for 24 hours if you were given a sedative during your procedure. Summary Varicocelectomy is a procedure to treat a varicocele, which is a swelling of veins inside the pouch that holds your testicles (scrotum). In this surgery, the swollen veins are tied off so that the flow of blood goes to other veins instead. Follow instructions from your health care provider about taking medicines and about eating and drinking before the procedure. This information is not intended to replace advice given to you by your health care provider. Make sure you discuss any questions you have with your health care provider. Document Revised: 03/25/2018 Document Reviewed: 03/25/2018 Elsevier Patient Education  2023 [employer]  Varicocele  A varicocele is a swelling of veins in the scrotum. The scrotum is the sac that contains the testicles. Varicoceles can occur on either side of the scrotum, but they are more common on the left side. They occur most often in teenage boys and young men. In most cases, varicoceles are not a serious problem. They are usually small and painless and do not require treatment. Tests may be done to confirm the diagnosis. Treatment may be needed if: A varicocele is large, causes a lot of pain, or causes pain when exercising. Varicoceles are found on both sides of the scrotum. A varicocele causes a decrease in the size of the testicle in a growing adolescent. The  person has fertility problems. What are the causes? This condition is the result of valves in the veins not working properly. Valves in the veins help to return blood from the scrotum and testicles to the heart. If these valves do not work well, blood flows backward and backs up into the veins, which causes the veins to swell. This is similar to what happens when varicose veins form in the leg. What are the signs or symptoms? Most varicoceles do not cause any symptoms. If symptoms do occur, they may include: Swelling on one side of the scrotum. The swelling may be more obvious when you are standing up. A lumpy feeling in the scrotum. A heavy feeling on one side of the scrotum. A dull ache in the scrotum, especially after exercise or prolonged standing or sitting. Slower growth or reduced size of the testicle on the side of the varicocele (in young males). Problems with fathering a child (fertility). This can occur if the testicle does not grow normally or if the condition causes problems with the sperm, such as a low sperm count or sperm that are not able to reach the egg (poor motility). How is this diagnosed? This condition is diagnosed based on: Your medical history. A physical exam. Your health care provider may inspect and feel (palpate) the scrotal area to check for swollen or enlarged veins. An ultrasound. This may be done to confirm the diagnosis and to help rule out  other causes of the swelling. How is this treated? Treatment is usually not needed for this condition. If you have any pain, your health care provider may prescribe or recommend medicine to help relieve it. You may need regular exams so your health care provider can monitor the varicocele to ensure that it does not cause problems. When further treatment is needed, it may involve one of these options: Varicocelectomy. This is a surgery in which the swollen veins are tied off so that the flow of blood goes to other veins  instead. Embolization. In this procedure, a small, thin tube (catheter) is used to place metal coils or other blocking items in the veins. This cuts off the blood flow to the swollen veins. Follow these instructions at home: Take over-the-counter and prescription medicines only as told by your health care provider. Wear supportive underwear. Use an athletic supporter when participating in sports activities. Keep all follow-up visits as told by your health care provider. This is important. Contact a health care provider if: Your pain is increasing. You have redness in the affected area. Your testicle becomes enlarged, swollen, or painful. You have swelling that does not decrease when you are lying down. One of your testicles is smaller than the other. Get help right away if: You develop swelling in your legs. You have difficulty breathing. Summary Varicocele is a condition in which the veins in the scrotum are swollen or enlarged. In most cases, varicoceles do not require treatment. Treatment may be needed if you have pain, have problems with infertility, or have a smaller testicle associated with the varicocele. In some cases, the condition may be treated with a procedure to cut off the flow of blood to the swollen veins. This information is not intended to replace advice given to you by your health care provider. Make sure you discuss any questions you have with your health care provider. Document Revised: 03/25/2018 Document Reviewed: 03/25/2018 Elsevier Patient Education  2023 [employer].

## 2021-11-27 NOTE — Progress Notes (Signed)
   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  This seems to be worse with physical activity, and causes a fair amount of scrotal pain.  It improves with rest and snug fitting underwear.  He had a scrotal ultrasound in March 2019 that confirmed a large left varicocele, and it sounds like he was actually scheduled for varicocelectomy at an outside hospital, but ultimately canceled that procedure secondary to some changes in his insurance.   PMH: Past Medical History:  Diagnosis Date   Poor dental hygiene     Social History:  reports that he has been smoking cigarettes. He has been smoking an average of .5 packs per day. He has been exposed to tobacco smoke. He has quit using smokeless tobacco.  His smokeless tobacco use included chew. He reports that he does not drink alcohol and does not use drugs.  Physical Exam: BP 132/83   Pulse 68    Constitutional:  Alert and oriented, No acute distress. Cardiovascular: No clubbing, cyanosis, or edema. Respiratory: Normal respiratory effort, no increased work of breathing. GI: Abdomen is soft, nontender, nondistended, no abdominal masses GU: Testicles 20 cc and descended bilaterally without masses, large grade 3 left varicocele with tenderness  Assessment & Plan:   31 year old male with a large left varicocele that has become increasingly symptomatic over the last few years.  We reviewed options including observation, varicocelectomy, or embolization.  We discussed that varicocelectomy is most effective when performed under the microscope, and I recommended referral to [facility] for consideration of microscopic varicocelectomy.  Referral placed to [facility] for consideration of microscopic varicocelectomy  Legrand Rams, MD 11/27/2021  [facility] [address], [address] [place], [place] [address] [phone]

## 2021-12-17 DIAGNOSIS — Z6826 Body mass index (BMI) 26.0-26.9, adult: Secondary | ICD-10-CM | POA: Diagnosis not present

## 2021-12-17 DIAGNOSIS — A084 Viral intestinal infection, unspecified: Secondary | ICD-10-CM | POA: Diagnosis not present

## 2021-12-18 ENCOUNTER — Telehealth: Payer: Self-pay | Admitting: Urology

## 2021-12-18 NOTE — Telephone Encounter (Signed)
Patient's aunt Inge Rise; she is on [facility]) called and stated that Dr. Monna Fam office told her they have not received a referral for patient. They told her to contact us to resend the referral.

## 2021-12-23 DIAGNOSIS — N50812 Left testicular pain: Secondary | ICD-10-CM | POA: Diagnosis not present

## 2021-12-23 DIAGNOSIS — I861 Scrotal varices: Secondary | ICD-10-CM | POA: Diagnosis not present

## 2021-12-23 DIAGNOSIS — N50811 Right testicular pain: Secondary | ICD-10-CM | POA: Diagnosis not present

## 2021-12-29 DIAGNOSIS — R768 Other specified abnormal immunological findings in serum: Secondary | ICD-10-CM | POA: Diagnosis not present

## 2022-01-28 DIAGNOSIS — R768 Other specified abnormal immunological findings in serum: Secondary | ICD-10-CM | POA: Diagnosis not present

## 2022-01-29 DIAGNOSIS — Z23 Encounter for immunization: Secondary | ICD-10-CM | POA: Diagnosis not present

## 2022-02-10 DIAGNOSIS — K7401 Hepatic fibrosis, early fibrosis: Secondary | ICD-10-CM | POA: Diagnosis not present

## 2022-02-10 DIAGNOSIS — B182 Chronic viral hepatitis C: Secondary | ICD-10-CM | POA: Diagnosis not present

## 2022-03-02 DIAGNOSIS — Z23 Encounter for immunization: Secondary | ICD-10-CM | POA: Diagnosis not present
